# Patient Record
Sex: Female | Born: 1980 | Hispanic: No | Marital: Married | Smoking: Never smoker
Health system: Southern US, Community
[De-identification: ages and names within clinical notes are randomized; demographics above are authoritative.]

## PROBLEM LIST (undated history)

## (undated) DIAGNOSIS — R51 Headache: Secondary | ICD-10-CM

## (undated) DIAGNOSIS — R519 Headache, unspecified: Secondary | ICD-10-CM

## (undated) DIAGNOSIS — F329 Major depressive disorder, single episode, unspecified: Secondary | ICD-10-CM

## (undated) DIAGNOSIS — A499 Bacterial infection, unspecified: Secondary | ICD-10-CM

## (undated) DIAGNOSIS — N926 Irregular menstruation, unspecified: Secondary | ICD-10-CM

## (undated) DIAGNOSIS — F32A Depression, unspecified: Secondary | ICD-10-CM

## (undated) DIAGNOSIS — K219 Gastro-esophageal reflux disease without esophagitis: Secondary | ICD-10-CM

## (undated) DIAGNOSIS — J302 Other seasonal allergic rhinitis: Secondary | ICD-10-CM

## (undated) HISTORY — PX: DILATION AND CURETTAGE OF UTERUS: SHX78

## (undated) HISTORY — DX: Depression, unspecified: F32.A

## (undated) HISTORY — DX: Headache: R51

## (undated) HISTORY — DX: Major depressive disorder, single episode, unspecified: F32.9

## (undated) HISTORY — DX: Bacterial infection, unspecified: A49.9

## (undated) HISTORY — DX: Headache, unspecified: R51.9

## (undated) HISTORY — PX: GALLBLADDER SURGERY: SHX652

## (undated) HISTORY — DX: Irregular menstruation, unspecified: N92.6

---

## 2005-04-30 ENCOUNTER — Inpatient Hospital Stay (HOSPITAL_COMMUNITY): Admission: AD | Admit: 2005-04-30 | Discharge: 2005-05-02 | Payer: Self-pay | Admitting: *Deleted

## 2005-05-03 ENCOUNTER — Ambulatory Visit (HOSPITAL_COMMUNITY): Admission: RE | Admit: 2005-05-03 | Discharge: 2005-05-03 | Payer: Self-pay | Admitting: General Surgery

## 2005-05-11 ENCOUNTER — Inpatient Hospital Stay (HOSPITAL_COMMUNITY): Admission: AD | Admit: 2005-05-11 | Discharge: 2005-05-11 | Payer: Self-pay | Admitting: Obstetrics & Gynecology

## 2005-05-11 ENCOUNTER — Ambulatory Visit: Payer: Self-pay | Admitting: Obstetrics & Gynecology

## 2005-05-14 ENCOUNTER — Inpatient Hospital Stay (HOSPITAL_COMMUNITY): Admission: AD | Admit: 2005-05-14 | Discharge: 2005-05-14 | Payer: Self-pay | Admitting: Obstetrics & Gynecology

## 2005-05-22 ENCOUNTER — Ambulatory Visit (HOSPITAL_COMMUNITY): Admission: RE | Admit: 2005-05-22 | Discharge: 2005-05-22 | Payer: Self-pay | Admitting: Obstetrics & Gynecology

## 2005-05-22 ENCOUNTER — Ambulatory Visit: Payer: Self-pay | Admitting: Obstetrics & Gynecology

## 2005-05-24 ENCOUNTER — Ambulatory Visit: Payer: Self-pay | Admitting: Obstetrics & Gynecology

## 2005-05-24 ENCOUNTER — Ambulatory Visit (HOSPITAL_COMMUNITY): Admission: RE | Admit: 2005-05-24 | Discharge: 2005-05-24 | Payer: Self-pay | Admitting: Obstetrics & Gynecology

## 2005-05-24 ENCOUNTER — Encounter (INDEPENDENT_AMBULATORY_CARE_PROVIDER_SITE_OTHER): Payer: Self-pay | Admitting: Specialist

## 2005-06-10 ENCOUNTER — Encounter (INDEPENDENT_AMBULATORY_CARE_PROVIDER_SITE_OTHER): Payer: Self-pay | Admitting: *Deleted

## 2005-06-10 ENCOUNTER — Ambulatory Visit (HOSPITAL_COMMUNITY): Admission: RE | Admit: 2005-06-10 | Discharge: 2005-06-11 | Payer: Self-pay | Admitting: General Surgery

## 2005-07-18 ENCOUNTER — Ambulatory Visit: Payer: Self-pay | Admitting: Obstetrics and Gynecology

## 2005-09-03 ENCOUNTER — Ambulatory Visit: Payer: Self-pay | Admitting: Obstetrics and Gynecology

## 2005-12-02 ENCOUNTER — Other Ambulatory Visit: Admission: RE | Admit: 2005-12-02 | Discharge: 2005-12-02 | Payer: Self-pay | Admitting: Obstetrics and Gynecology

## 2006-04-11 ENCOUNTER — Inpatient Hospital Stay (HOSPITAL_COMMUNITY): Admission: AD | Admit: 2006-04-11 | Discharge: 2006-04-14 | Payer: Self-pay | Admitting: Obstetrics and Gynecology

## 2006-04-11 ENCOUNTER — Encounter (INDEPENDENT_AMBULATORY_CARE_PROVIDER_SITE_OTHER): Payer: Self-pay | Admitting: *Deleted

## 2006-08-08 IMAGING — US US OB LIMITED
1 series · 14 of 21 positions shown · non-contrast
Comparison: none

CLINICAL DATA: Assess fetal viability, early pregnancy.  Cramping.

[Series 1: us ob limited · 21 acquisitions, 14 frames shown]
[im 1/21]
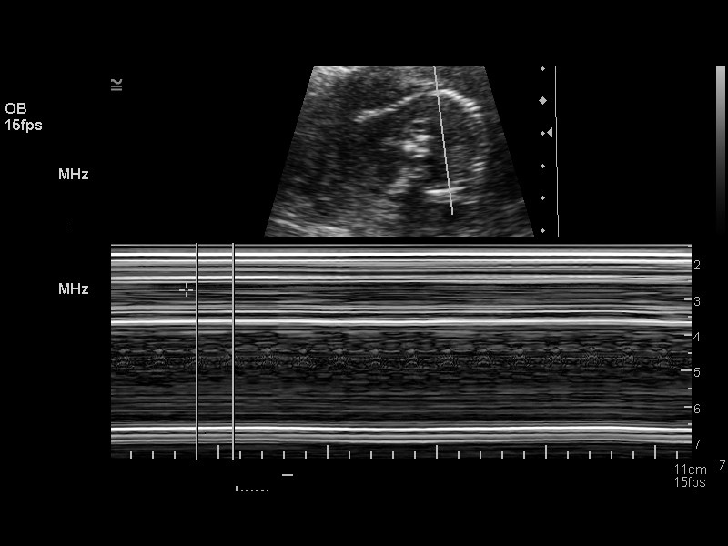
[im 3/21]
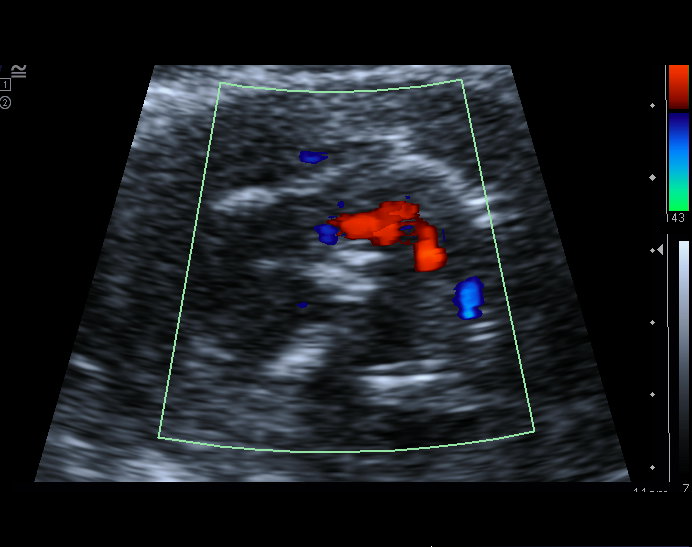
[im 4/21]
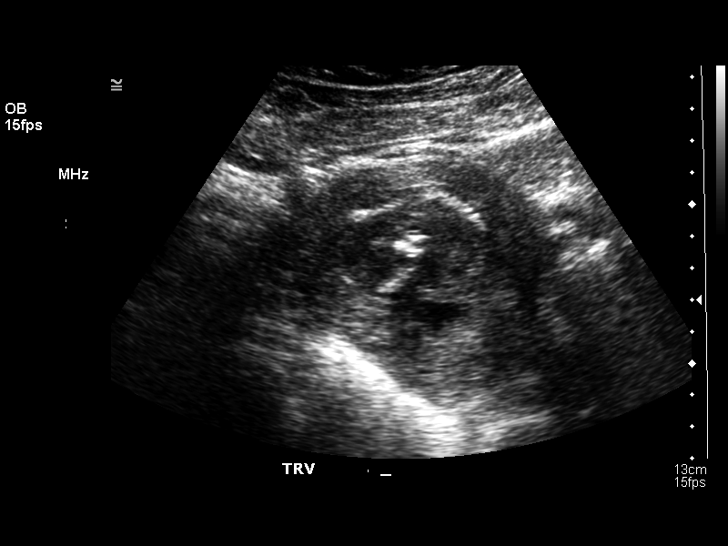
[im 6/21]
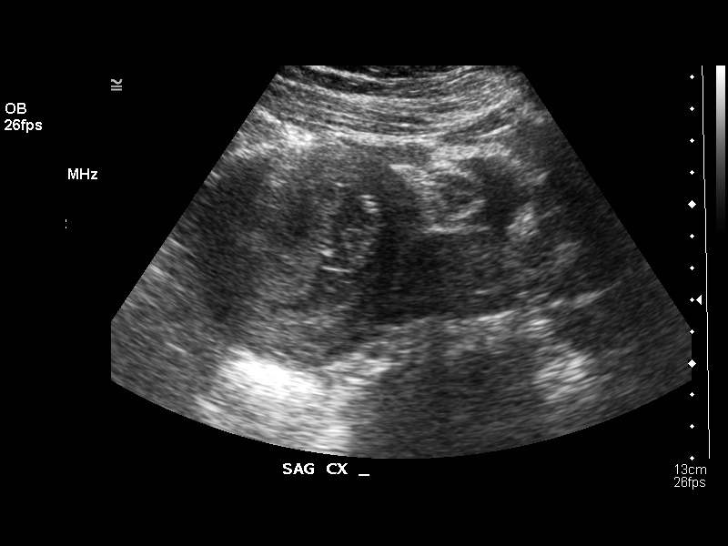
[im 7/21]
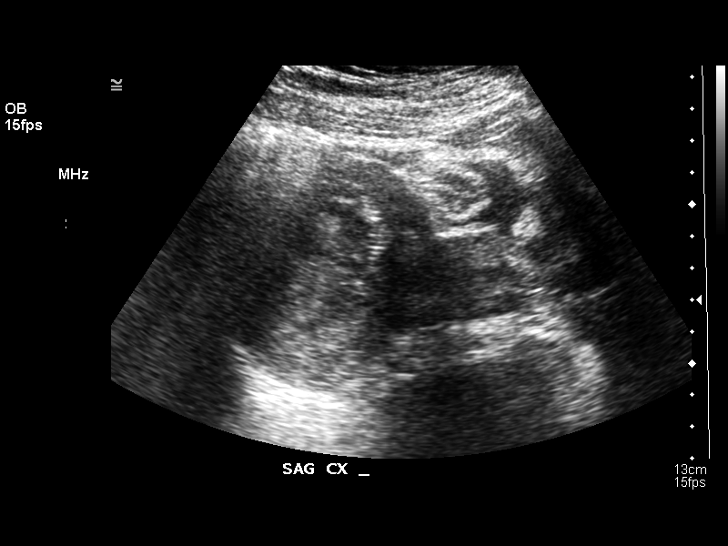
[im 9/21]
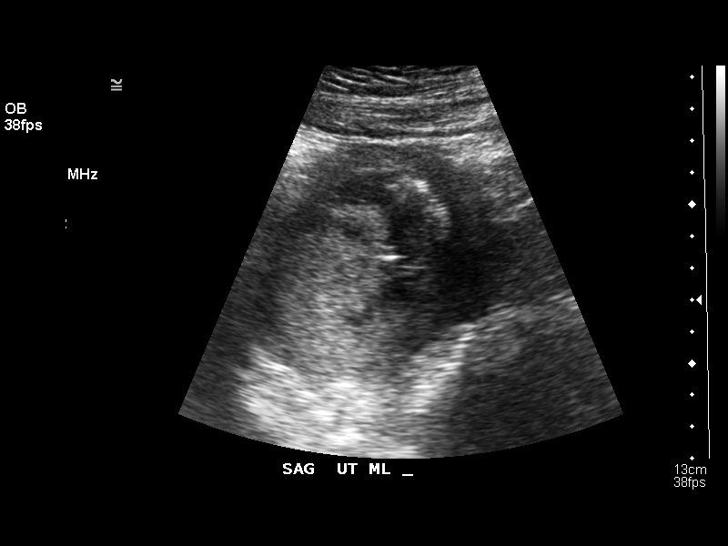
[im 10/21]
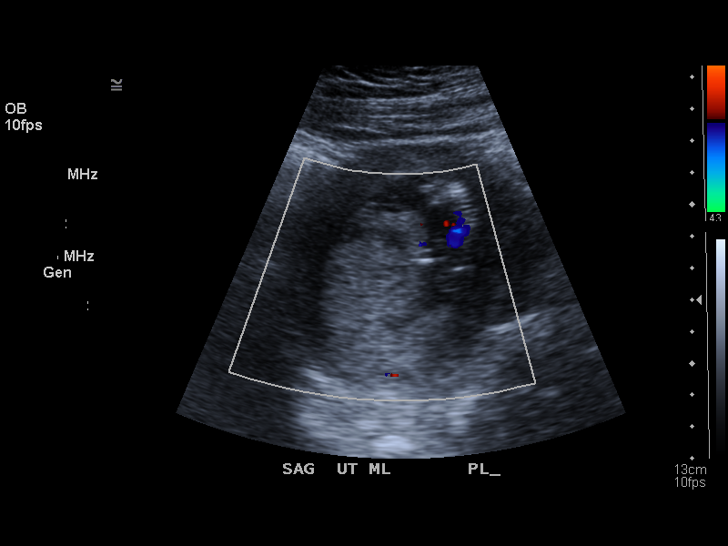
[im 12/21]
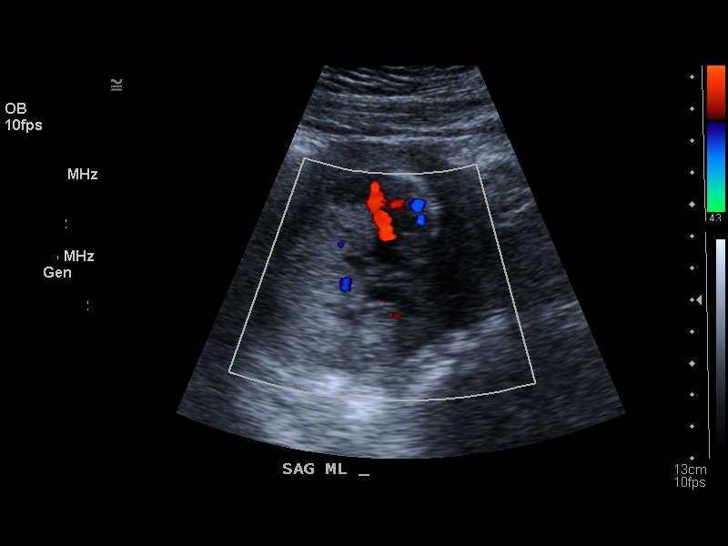
[im 13/21]
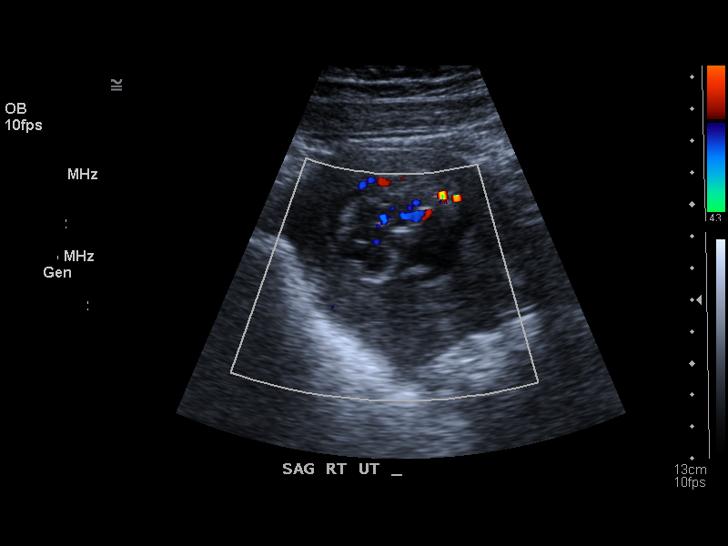
[im 15/21]
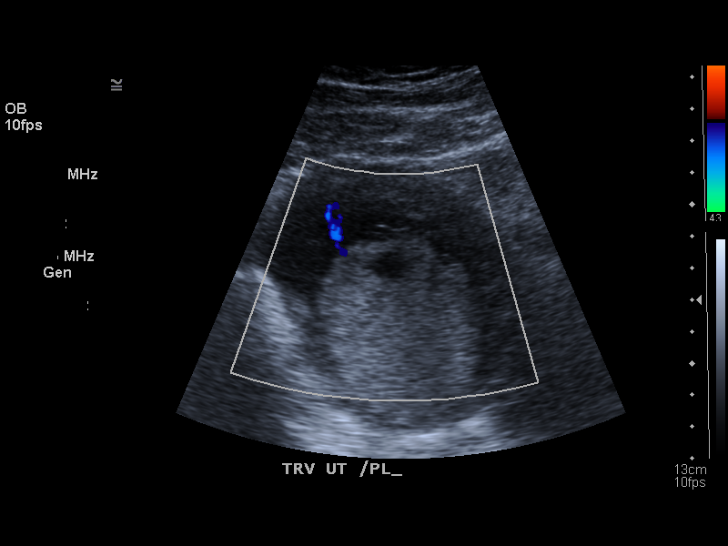
[im 16/21]
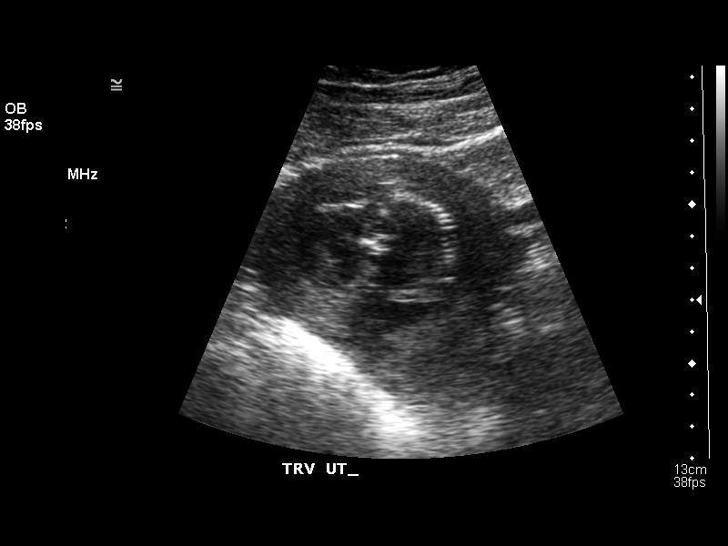
[im 18/21]
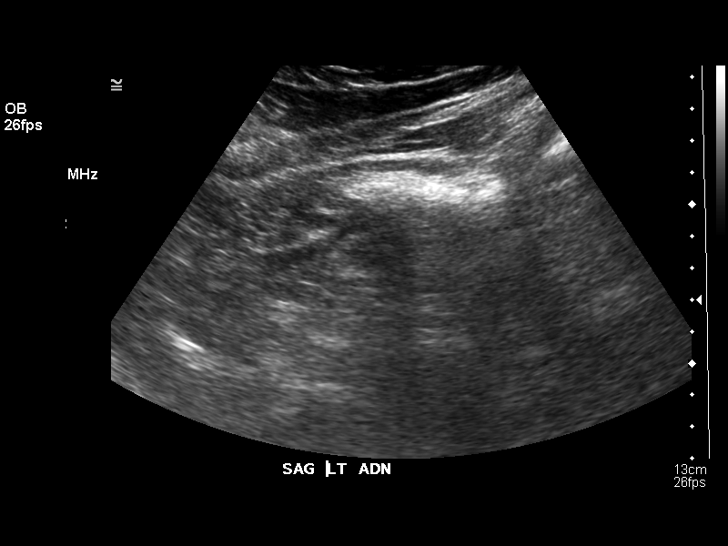
[im 19/21]
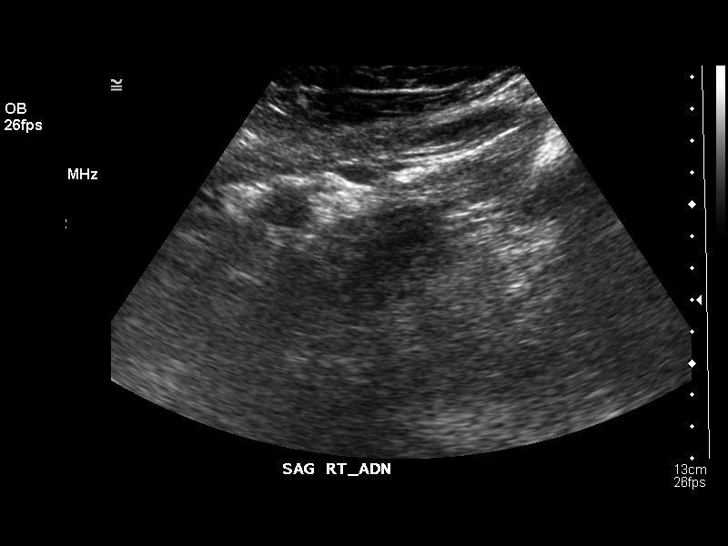
[im 21/21]
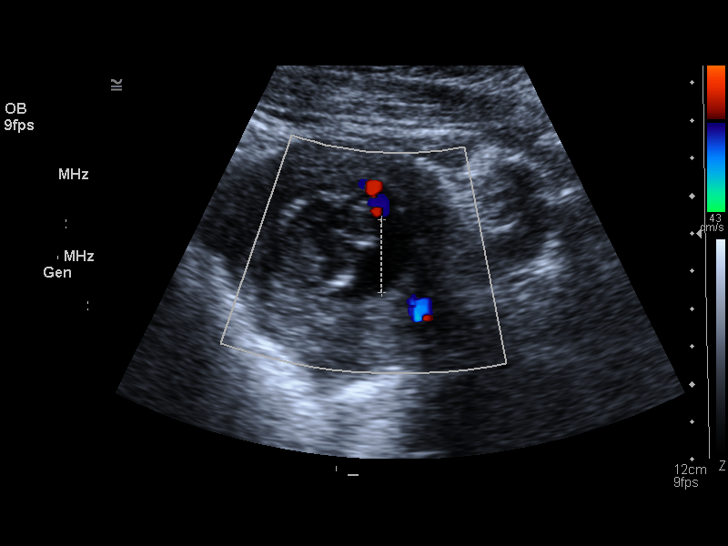

[14 of 21 positions shown; findings below may reference images not displayed]

LIMITED OBSTETRICAL ULTRASOUND:
 Number of Fetuses:  1
 Heart Rate:  150
 Movement:  Yes
 Breathing:  No
 Presentation:  Transverse with head to maternal right
 Placental Location:  Posterior
 Grade:  0
 Amniotic Fluid (Subjective):  Decreased
 Amniotic Fluid (Objective):  1.9 cm Vertical pocket 

 Fetal measurements and complete anatomic evaluation were not requested.  

 MATERNAL UTERINE AND ADNEXAL FINDINGS
 Cervix: 3.0 cm Transabdominally. Ovaries not visualized.
IMPRESSION: 1.  Single live intrauterine gestation seen.  Fetal cardiac rate of 180 bpm.  Amniotic fluid volume visually appears low with largest vertical pocket 1.9 cm.  Cervix appears closed, 3 cm in length.
 2.  Limited evaluation of morphology and placenta due to early gestational age and low amniotic fluid volume.

## 2006-09-04 IMAGING — RF DG CHOLANGIOGRAM OPERATIVE
1 series · 4 of 4 positions shown · non-contrast
Comparison: none

CLINICAL DATA: 24 year-old-female with biliary pancreatitis.  
 INTRAOPERATIVE DIAGNOSTIC CHOLANGIOGRAM:

[Series 1: run · 4 of 79 frames shown]
[frame 12/79]
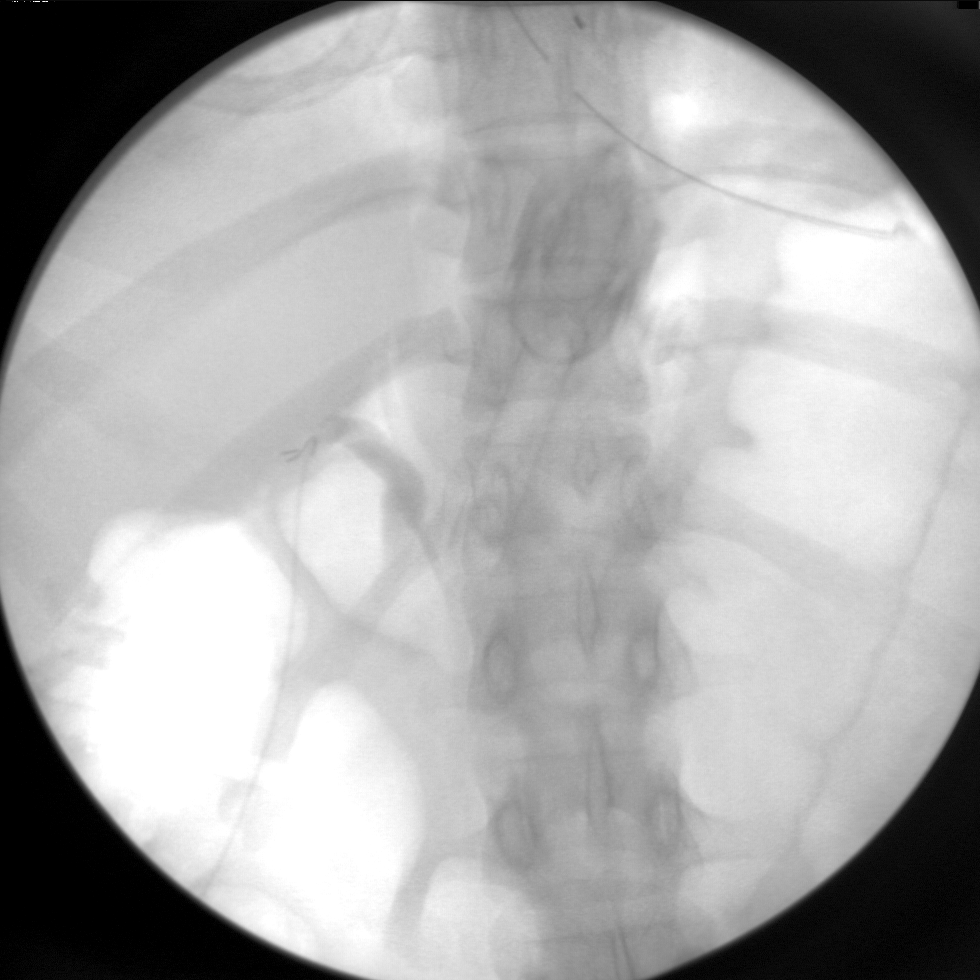
[frame 40/79]
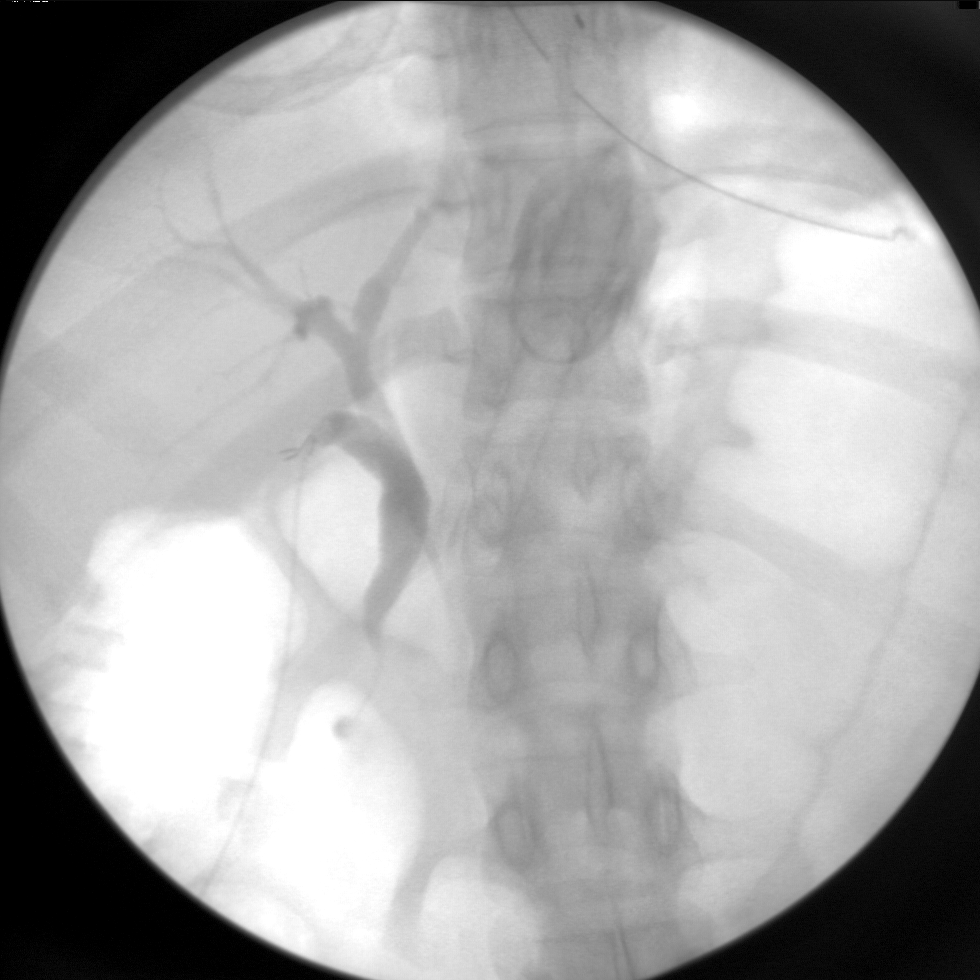
[frame 68/79]
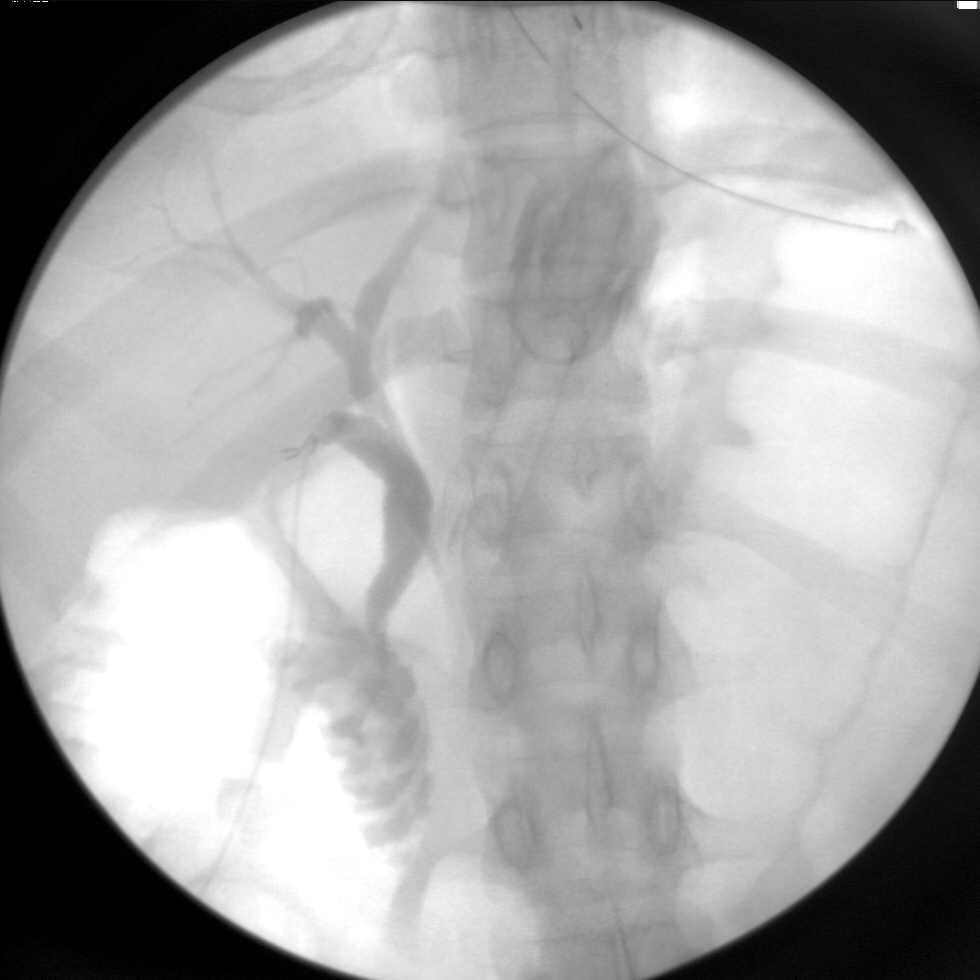
[frame 77/79]
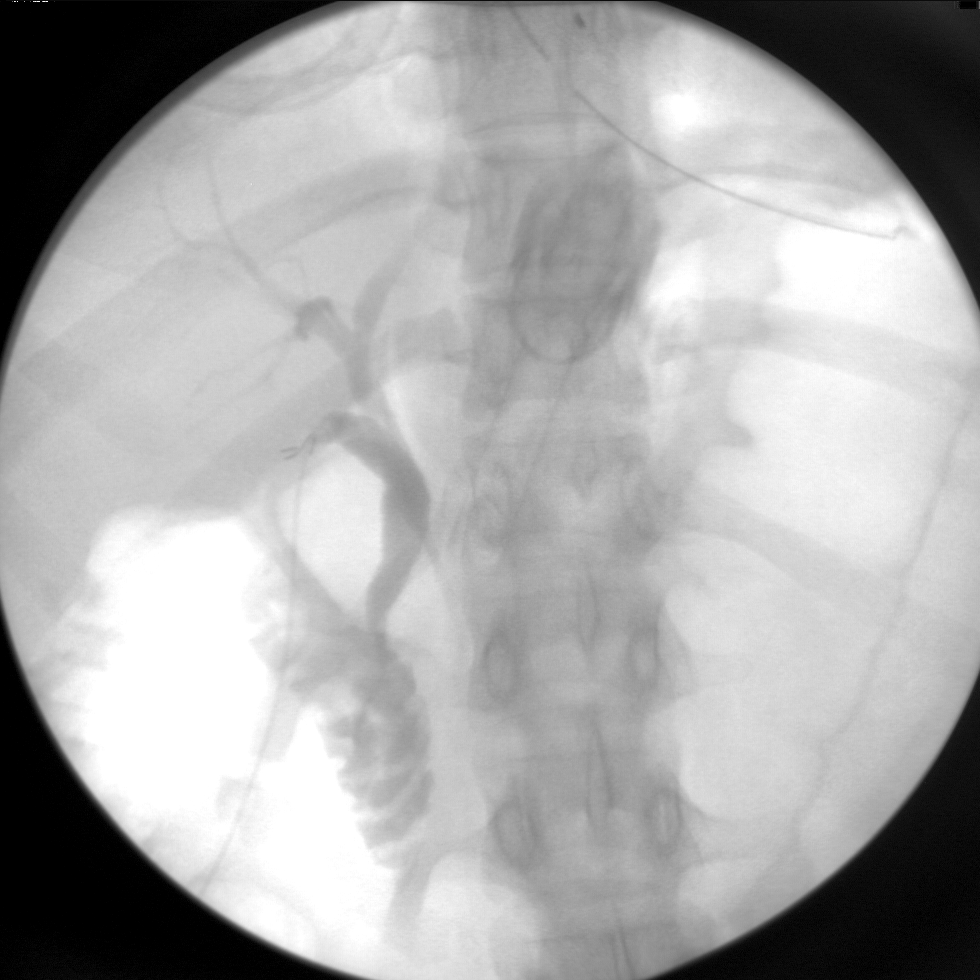

[4 of 4 positions shown; findings below may reference images not displayed]

FINDINGS: Intraoperative cholangiogram is performed following laparoscopic cholecystectomy.  There is a small catheter within the residual cystic duct.  Adjacent clips are present.  The opacified biliary system demonstrates mild prominence but evidence of spontaneous drainage with contrast entering the duodenum.  No biliary obstruction, retained stone, disease or choledocholithiasis.
IMPRESSION: Patent biliary system.

## 2008-01-14 ENCOUNTER — Inpatient Hospital Stay (HOSPITAL_COMMUNITY): Admission: AD | Admit: 2008-01-14 | Discharge: 2008-01-16 | Payer: Self-pay | Admitting: Obstetrics & Gynecology

## 2008-02-14 ENCOUNTER — Inpatient Hospital Stay (HOSPITAL_COMMUNITY): Admission: AD | Admit: 2008-02-14 | Discharge: 2008-02-17 | Payer: Self-pay | Admitting: Obstetrics and Gynecology

## 2010-07-02 ENCOUNTER — Emergency Department (HOSPITAL_COMMUNITY): Admission: EM | Admit: 2010-07-02 | Discharge: 2010-07-02 | Payer: Self-pay | Admitting: Emergency Medicine
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
## 2011-03-26 NOTE — H&P (Signed)
[name], [name]            ACCOUNT NO.:  [id number]   MEDICAL RECORD NO.:  [id number]          PATIENT TYPE:  INP   LOCATION:  9316                          [facility]   PHYSICIAN:  Osborn Coho, M.D.   DATE OF BIRTH:  [birth year]   DATE OF ADMISSION:  02/14/2008  DATE OF DISCHARGE:                              HISTORY & PHYSICAL   The patient is a 30 year old married [place] female who is 1 month  postpartum status post a spontaneous vaginal delivery who presents with  chief complaint of right-sided/back pain x3 days, nausea and vomiting  with just nausea this morning, headache which began this morning, and a  fever with T max of 103 with onset yesterday.  She is a gravida 3, para  2-0-1-2.  She is both breast feeding and bottle feeding.  She reports  her left fluid intake was 9:00 a.m. this morning when she had some tea.  Last solid food intake was 2100 last night.  She denies any major  medical problems.   PAST MEDICAL HISTORY:  1. C-section x1 with her first delivery.  2. Successful VBAC with this delivery.  3. Laparoscopic cholecystectomy.  4. D&C after spontaneous abortion.  5. Female circumcision.   She denies other medical history or past surgical history.   She has reports of constipation.  She denies any shortness of breath,  cough or other signs and symptoms of infection.  She does report some  dysuria and feeling like something in bladder after urination.  She  reports that lochia has ceased and no abnormal discharge noted.   ALLERGIES:  NO KNOWN DRUG ALLERGIES OR OTHER SENSITIVITIES.   PRESENT MEDICATIONS:  1. Prenatal vitamin 1 tablet p.o. daily  2. Motrin 600 mg p.o. t.i.d.   SOCIAL HISTORY:  She is a married [place] female.  Father of the baby  and husband was present initially and then went home to take care of her  two children and seems supportive.   FAMILY HISTORY:  Not significant.   LABORATORY DATA:  White count was 14.2, hemoglobin  12.6, hematocrit was  38, platelets 260.  Differential:  Neutrophils were high at 81%,  absolute granulocytes were high at 11.5, absolute monocytes 1.1,  lymphocytes were low at 11.  CMP was within normal limits, with the  exception of glucose being slightly high equal to 102.  Uric acid and  LDH were normal.  UA:  Hazy, specific gravity was 1020, moderate  hemoglobin, positive nitrites, moderate leukocyte esterase, and UA micro  showed many bacteria and few epithelials.  She does have blood cultures  x2 that are pending.   PHYSICAL EXAMINATION:  VITAL SIGNS:  On admission, temperature orally  was 104.1, blood pressure 96/32, heart rate was 32, respirations were  20.  GENERAL:  No acute distress.  She is alert and oriented x3 but is weak,  slow movement when asked to change her position.  There is a slight  language barrier.  She does speak [place].  CARDIOVASCULAR:  Regular rate and rhythm without murmur.  LUNGS:  Clear to auscultation bilaterally.  She has positive  right CVA  tenderness.  ABDOMEN:  Soft, nontender.  No rebound or guarding.  Fundus is not  palpable.  PELVIC:  Deferred.  EXTREMITIES:  No edema, no clonus.  DTRs within normal limits and  negative Homan's.   IMPRESSION:  1. Probable pyelonephritis.  2. Fever with maximum temperature of 104.1 after admission.  She did      receive 1 gram of Tylenol and decreased to 102.5.  3. Right flank pain x3 days.  4. Nausea and vomiting with just nausea today.  5. Positive costovertebral angle tenderness.   PLAN:  After consult with Dr. Osborn Coho, plan was made to admit the  patient for IV antibiotic therapy and to repeat her CBC with  differential in a.m. and to receive IV fluids.  Will continue to observe  closely, and M.D. to follow.      Candice [place], [place]      Osborn Coho, M.D.  Electronically Signed    CHS/MEDQ  D:  02/14/2008  T:  02/14/2008  Job:  [id number]

## 2011-03-29 NOTE — Op Note (Signed)
[name], [name]            ACCOUNT NO.:  [id number]   MEDICAL RECORD NO.:  [id number]          PATIENT TYPE:  INP   LOCATION:  9130                          [facility]   PHYSICIAN:  Crist Fat. Rivard, M.D. DATE OF BIRTH:  1981/09/22   DATE OF PROCEDURE:  04/11/2006  DATE OF DISCHARGE:                                 OPERATIVE REPORT   PREOPERATIVE DIAGNOSIS:  Intrauterine pregnancy at 37 weeks and 4 days,  breech presentation with severe oligohydramnios.   POSTOPERATIVE DIAGNOSIS:  Intrauterine pregnancy at 37 weeks and 4 days,  breech presentation with severe oligohydramnios.   PROCEDURE:  Primary low transverse cesarean section.   SURGEON:  Dr. Dois Davenport Rivard.   ASSISTANT:  Elsie Ra, certified nurse midwife.   ESTIMATED BLOOD LOSS:  500 mL.   PROCEDURE:  After being informed of the planned procedure with possible  complications including bleeding, infection, injury to bowel, bladder or  ureters, informed consent is obtained.  The patient is taken to OR #1 given  spinal anesthesia by Dr. Tacy Dura without complication.  She is placed in the  dorsal decubitus position, pelvis tilted to the left, prepped and draped in  a sterile fashion and a Foley catheter is inserted in her bladder.  After  assessing adequate level of anesthesia, we infiltrate the suprapubic area  with 20 mL of Marcaine 0.25 and we perform a Pfannenstiel incision which was  brought down to the fascia.  The fascia is incised in a low transverse  fashion.  Linea alba is dissected.  Peritoneum was entered in the midline  fashion.  Visceral peritoneum is entered in a low transverse fashion  allowing [place] to safely retract bladder by developing a bladder flap.  Myometrium is then entered in a low transverse fashion first sharply with  knife and extended bluntly.  Amniotic fluid is clear.  We assist the birth  of a female infant in frank breech at 4:49 p.m. A nuchal cord reduces itself  spontaneously with  delivery of the head which went rapidly.  Mouth and nose  were suctioned.  Cord is clamped with two Kelly clamps and sectioned and  baby is given to the pediatrician present in the room.  20 mL of blood was  drawn from the umbilical vein and the placenta is allowed to deliver  spontaneously.  It is complete.  The cord has three vessels and uterine  revision is negative.  Placenta is sent to pathology.   We proceed with closure of the myometrium in two layers, first with a  running lock suture of 0 Vicryl, then with a Lembert suture of 0 Vicryl  imbricating the first layer.  Hemostasis was checked and deemed adequate.  Both paracolic gutters were cleaned.  Both tubes and ovaries assessed and  normal.  The pelvis is then profusely irrigated with warm saline and  hemostasis is rechecked and deemed adequate.   Under fascia hemostasis was completed with cautery and the fascia is closed  with two running sutures of one Vicryl meeting midline.  The wound is then  irrigated with warm saline.  Hemostasis was completed  with cautery and the  skin is closed with a subcuticular suture of 3-0 Monocryl and Steri-Strips.   Instruments and sponge count is complete x2.  Estimated blood loss is 500  mL.  The procedure is very well tolerated by the patient who is taken to  recovery room in a well and stable condition.   Little girl, unnamed at this time, was born at 4:49 p.m. received an Apgar  of 9 at one  minute 9 at five minutes and weighs 6 pounds 5 ounces.      Crist Fat Rivard, M.D.  Electronically Signed     SAR/MEDQ  D:  04/11/2006  T:  04/12/2006  Job:  [id number]

## 2011-03-29 NOTE — Op Note (Signed)
NAMENORIE, LATENDRESSE            ACCOUNT NO.:  [id number]   MEDICAL RECORD NO.:  [id number]          PATIENT TYPE:  AMB   LOCATION:  DAY                          FACILITY:  [facility]   PHYSICIAN:  Sharlet Salina T. Hoxworth, M.D.DATE OF BIRTH:  Mar 06, 1981   DATE OF PROCEDURE:  06/10/2005  DATE OF DISCHARGE:                                 OPERATIVE REPORT   PREOPERATIVE DIAGNOSES:  Cholelithiasis and history of gallstone  pancreatitis.   POSTOPERATIVE DIAGNOSES:  Cholelithiasis and history of gallstone  pancreatitis.   SURGICAL PROCEDURES:  Laparoscopic cholecystectomy with intraoperative  cholangiogram.   SURGEON:  Sharlet Salina T. Hoxworth, M.D.   ASSISTANTAngelia Mould. Derrell Lolling, M.D.   ANESTHESIA:  General.   BRIEF HISTORY:  Ms. Murthy is a 30 year old female with a recent episode  of transient severe epigastric pain. There was biochemical evidence of acute  pancreatitis that resolved and gallbladder ultrasound has shown multiple  gallstones. Laparoscopic cholecystectomy with cholangiogram has been  recommended and accepted. The nature of the procedure, indications, risks of  bleeding, infection, bile leak and bile duct injury were discussed and  understood. She is now brought to the operating room for this procedure.   DESCRIPTION OF PROCEDURE:  The patient was brought to the operating room,  placed in supine position on the operating table and general endotracheal  anesthesia was induced. The abdomen was widely sterilely prepped and draped.  She received preoperative antibiotics. PAS were in place. Correct patient  and procedure were confirmed. Local anesthesia was used to infiltrate the  trocar sites prior to the incisions. A 1 cm incision was made at the  umbilicus and dissection carried down the midline fascia which was sharply  incised for 1 cm and the peritoneum entered under direct vision. Through a  mattress suture of 0 Vicryl, the Hasson trocar was placed and  pneumoperitoneum established. Under direct vision, a 10 mm trocar was placed  in the subxiphoid area and two 5 mm trocars along the right subcostal  margin. The gallbladder was visualized and appeared somewhat chronically  thickened. The fundus was grasped and elevated up over the liver and the  infundibulum retracted inferolaterally. The common bile duct was visualized  and appeared upper limits of normal. Fibrofatty tissue was stripped down off  of the neck of the gallbladder toward the porta hepatis. The cystic duct,  lymph node and artery were identified and the gallbladder wall divided  between clips. The cystic duct was dissected free over about a centimeter  and a nice window created clarifying the anatomy. The cystic duct did appear  somewhat dilated consistent with passage of stones. The cystic duct was  clipped at the junction of the gallbladder and operative cholangiogram  obtained through the cystic duct. This showed good filling of a normal  common bile duct and intrahepatic ducts with free flow into the duodenum and  no filling defects. The cystic duct was clipped. The clip did traverse the  duct, but due to some chronic thickening and slight dilatation, we also  placed a PDS Endoloop over the cystic duct after division. The gallbladder  was then  dissected free from its bed using hook cautery, placed in the  EndoCatch bag and brought out through the umbilicus. The right upper  quadrant was irrigated and complete hemostasis assured. Trocars removed  under direct vision and all CO2 evacuated. The mattress suture was secured  at the umbilicus. The skin incisions were closed with interrupted  subcuticular 4-0 Monocryl and Steri-Strips. Sponge, needle and instrument  counts were correct. Dry sterile dressings were applied and the patient  taken to recovery room in good condition.       BTH/MEDQ  D:  06/10/2005  T:  06/11/2005  Job:  [id number]

## 2011-03-29 NOTE — Op Note (Signed)
[name], [name]            ACCOUNT NO.:  [id number]   MEDICAL RECORD NO.:  [id number]          PATIENT TYPE:  AMB   LOCATION:  SDC                           [facility]   PHYSICIAN:  Lesly Dukes, M.D. DATE OF BIRTH:  December 17, 1980   DATE OF PROCEDURE:  05/24/2005  DATE OF DISCHARGE:                                 OPERATIVE REPORT   POSTOPERATIVE DIAGNOSIS:  The patient is a 30 year old G1, P0, at 13-1/2  weeks' estimated gestational age with missed abortion and prolonged  premature rupture of membranes.   PREOPERATIVE DIAGNOSIS:  The patient is a 30 year old G1, P0, at 13-1/2  weeks' estimated gestational age with missed abortion and prolonged  premature rupture of membranes.   PROCEDURES:  1.  Dilation and evacuation.  2.  Repair of cervical laceration.   SURGEON:  Lesly Dukes, M.D.   ANESTHESIA:  MAC and local.   SPECIMENS:  EMC.   ESTIMATED BLOOD LOSS:  400.   COMPLICATIONS:  None.   FINDINGS:  A 13 week 2 day size fetus with a retroverted uterus.   PROCEDURE:  After informed consent was obtained, the patient was taken to  the operating room, where MAC anesthesia was induced.  The patient was  placed in the dorsal lithotomy position and prepared and draped in the  normal sterile fashion.  The bladder was in-and-out catheterized.  The  anterior lip of the cervix was grasped with a single-tooth tenaculum and the  cervix was gently dilated to a #12 Hegar dilator.  A curved #12 suction  curette was gently introduced into the uterus under ultrasound guidance.  A  gentle suction curettage was noted until the uterus was noted to be empty on  ultrasound.  Inspection of the anterior lip of the cervix after the D&E  revealed some lacerations from the single-tooth tenaculum.  The anterior lip  of the cervix was reapproximated with 0 chromic in a running locked fashion.  Good hemostasis was noted.  The patient will be told about the cervical  laceration.  The  patient tolerated the procedure well.  The sponge, lap,  instrument and needle count were correct x2 and the patient went to recovery  in stable condition.      KHL/MEDQ  D:  05/24/2005  T:  05/24/2005  Job:  [id number]

## 2011-03-29 NOTE — Group Therapy Note (Signed)
NAMENOHELI, MELDER NO.:  [id number]   MEDICAL RECORD NO.:  [id number]          PATIENT TYPE:  WOC   LOCATION:  [facility]                   FACILITY:  [facility]   PHYSICIAN:  Argentina Donovan, MD        DATE OF BIRTH:  April 19, 1981   DATE OF SERVICE:  07/18/2005                                    CLINIC NOTE   REASON FOR VISIT:  The patient is a 30 year old gravida 1 para 0-0-1-0 who  had a 13-week loss and who just recently in July had a D&C for a missed  abortion. The patient has no problem since that time. We are going to check  her with an anticardiolipin antibody, antiphospholipid antibody, TSH,  antithyroid antibody, and antinuclear antibody, and start her on prenatal  vitamins with folic acid. We also suggest she goes to the high risk clinic  when she does get pregnant, and we have counseled them against getting  pregnant for at least 1 more month.   IMPRESSION:  Mid trimester miscarriage.           ______________________________  Argentina Donovan, MD     PR/MEDQ  D:  07/18/2005  T:  07/19/2005  Job:  [id number]

## 2011-03-29 NOTE — Group Therapy Note (Signed)
NAMEZOELLE, MARKUS NO.:  [id number]   MEDICAL RECORD NO.:  [id number]          PATIENT TYPE:  WOC   LOCATION:  [facility]                   [facility]ILITY:  [facility]   PHYSICIAN:  Argentina Donovan, MD        DATE OF BIRTH:  1981-09-18   DATE OF SERVICE:                                    CLINIC NOTE   SUBJECTIVE:  The patient is a 30 year old gravida 1, para 0-0-1-0 who had a  13-week loss recently, and had a D&C for missed abortion.  She has had no  problem since that time, and comes in today after an antiphospholipid  screening, and we find that she has an elevated ANA with a 1:160 titer.  The  patient has been on multivitamins with folic acid.  I am starting her on a  baby aspirin today and referring her to the clinic.   IMPRESSION:  Post-spontaneous abortion with positive ANA titer.           ______________________________  Argentina Donovan, MD     PR/MEDQ  D:  09/03/2005  T:  09/03/2005  Job:  [id number]

## 2011-03-29 NOTE — Discharge Summary (Signed)
[name], [name]            ACCOUNT NO.:  [id number]   MEDICAL RECORD NO.:  [id number]          PATIENT TYPE:  INP   LOCATION:  9316                          [facility]ILITY:  [facility]   PHYSICIAN:  [name], M.D. DATE OF BIRTH:  1981/01/03   DATE OF ADMISSION:  02/14/2008  DATE OF DISCHARGE:  02/17/2008                               DISCHARGE SUMMARY   DISCHARGING PHYSICIAN:  Julie A. Dillard, MD   ADMISSION DIAGNOSES:  1. Four weeks postpartum.  2. Pyelonephritis.  3. Nausea and vomiting.   DISCHARGE DIAGNOSES:  1. Four weeks postpartum.  2. Pyelonephritis.  3. Nausea and vomiting.   HOSPITAL PROCEDURES:  1. IV hydration.  2. IV antibiotics.  3. Blood cultures.   HOSPITAL COURSE:  The patient was admitted 1 month postpartum with right-  sided back pain, flank pain, nausea, vomiting, headache, and fever.  She  had a white blood cell count of 14.2. Temperature was 104 degrees.  Urine culture was collected and blood cultures were collected.  She was  admitted for IV fluids and antibiotics, and was given Tylenol for her  fever.  Urinalysis showed moderate hemoglobin and positive nitrites with  moderate leukocyte esterase.  Chemistries were within normal limits.  Urine was sent to culture.  On hospital day #2, she was doing okay, but  had a headache and right flank pain was persistent.  She was breast-  feeding without problems.  She was afebrile at 98.1.  Other vital signs  were normal.  White blood cell count was down to 12.1 and hemoglobin  10.4.  Cultures were pending.  She continued to receive ceftriaxone.  On  hospital day #3, she was feeling better with less pain and her right  flank.  She was tolerating food and fluids by mouth.  Fevers began to  diminish.  Urine culture showed positive E. coli sensitive to Rocephin,  and blood culture showed no growth.  On hospital day #4, she was ready  to go home.  She was feeling better, breast-feeding without problems.  She  was afebrile for greater than 24 hours.  Chest was clear.  Heart,  regular rate and rhythm.  There was no CVA tenderness.  Abdomen, soft  and nontender.  Extremities within normal limits.  She was deemed to  receive full benefit of her hospital stay and was discharged home.   DISCHARGE LABS:  White blood cell count 12.1, hemoglobin 10.4, and  platelets 206.  Urine culture, E. coli.  Blood cultures negative.   DISCHARGE MEDICATIONS:  1. Motrin p.r.n.  2. Ampicillin 500 mg p.o. b.i.d. x7 days.   DISCHARGE INSTRUCTIONS:  Discharge instructions include adequate  hydration and monitoring for fever.   DISCHARGE FOLLOWUP:  In 2 weeks or p.r.n.   CONDITION ON DISCHARGE:  Good.      [name], C.N.M.      [name], M.D.  Electronically Signed    MLW/MEDQ  D:  04/06/2008  T:  04/07/2008  Job:  [id number]

## 2011-03-29 NOTE — H&P (Signed)
[name], [name]            ACCOUNT NO.:  [id number]   MEDICAL RECORD NO.:  [id number]          PATIENT TYPE:  OUT   LOCATION:  ULT                           [facility]   PHYSICIAN:  Lesly Dukes, M.D. DATE OF BIRTH:  04-30-1981   DATE OF ADMISSION:  05/22/2005  DATE OF DISCHARGE:                                HISTORY & PHYSICAL   HISTORY OF PRESENT ILLNESS:  The patient is a 30 year old gravida 1, para 0,  LMP January 28, 2005, with an approximately 13-1/2 week IUFD.  The patient was  PPROM'd approximately 2 weeks ago and had refused D&E until the fetus died.  The patient missed several appointments, however, did show up today and was  noted to have no FH.  There was an official ultrasound by radiology.  The  patient now agrees to have a D&E.  She understands the risks of this  procedure including, but not limited to, bleeding, infection, and damage to  the uterus.  We also talked about the likelihood of intrauterine scarring,  however, this is very unlikely.   PAST MEDICAL HISTORY:  Gallstones in need of a cholecystectomy.  She was to  have this during her second trimester of pregnancy.  She is holding off on  this for now and will do it after the D&E.   PAST SURGICAL HISTORY:  Denies.   ALLERGIES:  Denies.   MEDICATIONS:  Denies.   OB HISTORY/GYN HISTORY:  Gravida 1.  Has never had a pelvic, so has never  had any GYN problems that she is aware of.   PHYSICAL EXAMINATION:  GENERAL:  Well-nourished, well-developed, and in no  apparent distress.  LUNGS:  Clear to auscultation bilaterally.  ABDOMEN:  Soft, nontender, and nondistended.  BACK:  No CVA tenderness.  CARDIOVASCULAR:  Regular rate and rhythm.  PELVIC:  Genitalia is Tanner V  the rest of the pelvic cannot be done unless  the patient is heavily sedated.  We will do a pelvic prior to the procedure  after the patient is under anesthesia.   ASSESSMENT:  A 30 year old gravida 1 female with 13-1/3 week  intrauterine  fetal demise.   PLAN:  Do a suction D&E and follow-up with general surgery afterward for a  possible cholecystectomy.       KHL/MEDQ  D:  05/22/2005  T:  05/22/2005  Job:  [id number]

## 2011-03-29 NOTE — H&P (Signed)
[name], [name]            ACCOUNT NO.:  [id number]   MEDICAL RECORD NO.:  [id number]          PATIENT TYPE:  MAT   LOCATION:  MATC                          [facility]  [facility]   PHYSICIAN:  Crist Fat. Rivard, M.D. DATE OF BIRTH:  09-Sep-1981   DATE OF ADMISSION:  04/11/2006  DATE OF DISCHARGE:                                HISTORY & PHYSICAL   Patient is a 30 year old gravida 2, para 0-0-1-0 who was admitted at 37-4/[redacted]  weeks gestation from [facility] office for primary low  transverse cesarean section secondary to breech presentation and  oligohydramnios.  Amniotic fluid index on ultrasound today found to be 6.13  cm which corresponds to less than the third percentile for amniotic fluid  for gestational age.  This amniotic fluid index is decreased from 7.13 cm on  Apr 09, 2006.  Biophysical profile obtained today at [facility] office with  results of 8/8.  Fetus is also noted to be in breech presentation.  Patient  reports that her fetus is moving normally.  Patient is not having regular  contractions and denies leakage of fluid or bleeding.  Patient's last p.o.  intake at approximately noon April 11, 2006.  Patient's pregnancy has been  remarkable for status post cholecystectomy prior to pregnancy, language  barrier as patient is from [place], female circumcision, group B Strep in  urine, and history of oligohydramnios at 30 weeks resolved spontaneously.   HISTORY OF PRESENT PREGNANCY:  Patient entered care at [redacted] weeks gestation.  Patient's pregnancy has been followed by the C.N.M. service at [facility].  Patient's EDC is April 28, 2006.  Ultrasound obtained at 20  weeks revealed growth consistent with previous dating, cervix 3.36 cm.  Group B Strep is noted in the urine at 24 weeks of gestation.  Patient was  treated with pen VK at that time for group B Strep bacteruria.  Glucola  obtained at 28 weeks with the result of 94.  Repeat urine culture at 28  weeks  revealed no group B Strep at that time.  Ultrasound done at 30 weeks  secondary to lagging fundal height revealed breech presentation, estimated  fetal weight at the 70th percentile, and AFI of 7.5 cm with normal Dopplers  and a left fetal renal pyelectasis.  Patient underwent a non-stress test at  that time and was placed on bed rest.  At that time follow-up ultrasound  repeated at 31 weeks revealed AFI within normal limits at 40th percentile  and normal biophysical profile.  Ultrasound repeated at 33 weeks to reassess  amniotic fluid revealed amniotic fluid index at 11.33 cm at the 30th  percentile and left renal pyelectasis decreased as well at that time.  The  patient was allowed to resume her normal activities at 31 weeks.  Patient  with complaints of upper respiratory tract infection at 35 weeks.  Patient  was given prescription for Zyrtec at 36 weeks due to allergies.  Patient  declined third trimester cultures.  Patient's ultrasound repeated at 37  weeks and 1 day revealed breech presentation and amniotic fluid index of  7.13  cm at the 4th percentile with biophysical of 8 out of 8.  Patient was  again placed on bed rest and was to increase her fluids.  Patient returned  to the office as directed on June 1, day of admission, for repeat ultrasound  for fluid which revealed AFI of 6.13 cm, less than the 3rd percentile.  Secondary to the continued decrease in amniotic fluid decision was made,  after consulting with Dr. Estanislado Pandy, to proceed with primary low transverse  cesarean section for the patient.  Patient and the husband were given risks,  benefits, and alternatives and agreed to proceed.   PRENATAL LABORATORIES:  Blood type O+.  Antibody screen negative.  Sickle  cell trait negative.  Initial hemoglobin 12.4, platelets 217,000.  RPR  nonreactive.  Rubella titer immune.  Hepatitis B surface antigen negative.  HIV nonreactive.  Glucola 94.  RPR at 28 weeks nonreactive.  Group B  Strep  positive in urine, therefore, vaginal tract culture not done.  The patient  declined third trimester GC/Chlamydia cultures.   GYN HISTORY:  Patient with monthly menses with no dysmenorrhea, no  intramenstrual bleeding.  Patient denies history of abnormal Paps or STDs.   MEDICAL HISTORY:  Significant for gallbladder disease in the past.   SURGICAL HISTORY:  Cholecystectomy July of 2006.   FAMILY HISTORY:  Paternal grandfather with hypertension.  Father with  diabetes.   GENETIC HISTORY:  Father of the baby's mother with twins, otherwise  negative.   ALLERGIES:  No known drug allergies.   CURRENT MEDICATIONS:  Prenatal vitamins.   SOCIAL HISTORY:  Patient is married to the father of the baby, Druscilla Brownie.  Father of the baby is involved and supportive.  Patient and her  husband are Muslim in their faith.  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  Father of the baby is a Curator with 16 years of education.  Patient denies use of alcohol, tobacco, and street drugs.   OB HISTORY:  Pregnancy #11 May 2005 spontaneous AB at 13 weeks with a D&C  performed without complications.  Pregnancy #2 is present.   PHYSICAL EXAMINATION:  VITAL SIGNS:  Patient is afebrile.  Vital signs are  stable.  HEENT:  Within normal limits.  LUNGS:  Clear.  HEART:  Regular rate and rhythm.  BREASTS:  Soft.  ABDOMEN:  Soft, gravid, and nontender with fundal height consistent with 36  cm.  Fetus noted to be in breech presentation in longitudinal lie on  Leopold's.  Fetal heart rate noted to be in the 140s with accelerations  noted to be present and variability also noted to be present.  Uterine  contractions:  None noted on external fetal monitor at the present time.  PELVIC:  Vaginal examination is deferred.  EXTREMITIES:  No edema noted.  Negative Homans' bilaterally.   ASSESSMENT:  1.  Intrauterine pregnancy 37-4/[redacted] weeks gestation.  2.  Oligohydramnios. 3.  Breech  presentation.   PLAN:  Consult obtained with Dr. Dois Davenport Rivard per Nigel Bridgeman, C.N.M.  Patient will be admitted for primary low transverse cesarean section.  Risks, benefits, and alternatives of primary low transverse cesarean section  discussed with the patient and her husband and they desire to proceed.  Routine preoperative orders per M.D.     Rhona Leavens, CNM      Crist Fat Rivard, M.D.  Electronically Signed   NOS/MEDQ  D:  04/11/2006  T:  04/11/2006  Job:  [id number]

## 2011-03-29 NOTE — Discharge Summary (Signed)
[name], [name]            ACCOUNT NO.:  [id number]   MEDICAL RECORD NO.:  [id number]          PATIENT TYPE:  INP   LOCATION:  9130                          [facility]   PHYSICIAN:  Hal Morales, M.D.DATE OF BIRTH:  07-Feb-1981   DATE OF ADMISSION:  04/11/2006  DATE OF DISCHARGE:  04/14/2006                                 DISCHARGE SUMMARY   ADMITTING DIAGNOSES:  1.  Intrauterine pregnancy at 37-4/7 weeks.  2.  Breech presentation.  3.  Severe oligohydramnios.   PROCEDURE:  Primary low transverse cesarean section.   DISCHARGE DIAGNOSES:  1.  Intrauterine pregnancy at term, delivered.  2.  Severe oligohydramnios.  3.  Primary low transverse cesarean section.   [name] is a 30 year old gravida 2, para 0-0-1-0, who was admitted at  37-4/7 weeks from the office of [facility] for primary low  transverse cesarean section secondary to breech presentation and severe  oligohydramnios.  This was accomplished on April 11, 2006, with Dr. Dois Davenport  Rivard as surgeon.  The patient gave birth to a 6 pound 5 ounce female  infant with Apgar scores of 9 at 1 minute  and 9 at 5 minutes. Both the  patient and infant have done well in the postoperative period. The patient's  vital signs have been stable.  She is afebrile.  Her hemoglobin on the first  postoperative day was 10. The baby is breast feeding well.  The patient's  incision is clean, dry and intact, and on this, her third postoperative day,  she is judged to be in satisfactory condition for discharge.  Discharge  instructions per [facility] handout.   DISCHARGE MEDICATIONS:  1.  Motrin 600 mg p.o. q. 6 h p.r.n. pain.  2.  Tylox 1 to 2 p.o. q. 3-4 h pain.  3.  Prenatal vitamins.   Discharge follow-up will be a [facility] in 6 weeks.      Rica Koyanagi, C.N.M.      Hal Morales, M.D.  Electronically Signed   SDM/MEDQ  D:  04/14/2006  T:  04/14/2006  Job:   [id number]

## 2011-08-05 LAB — CBC
MCHC: 33.9
MCV: 80
MCV: 80.1
Platelets: 226
RDW: 15.5
RDW: 15.8 — ABNORMAL HIGH

## 2011-08-05 LAB — RPR: RPR Ser Ql: NONREACTIVE

## 2011-08-06 LAB — COMPREHENSIVE METABOLIC PANEL
ALT: 31
Albumin: 3.6
Alkaline Phosphatase: 122 — ABNORMAL HIGH
BUN: 8
CO2: 26
Calcium: 9.3
Chloride: 102
Creatinine, Ser: 0.89
Potassium: 4.2
Total Protein: 8.2

## 2011-08-06 LAB — URINE CULTURE: Colony Count: >=100000

## 2011-08-06 LAB — DIFFERENTIAL
Basophils Absolute: 0
Basophils Absolute: 0
Eosinophils Absolute: 0
Eosinophils Relative: 0
Lymphocytes Relative: 11 — ABNORMAL LOW
Lymphocytes Relative: 18
Monocytes Absolute: 1.1 — ABNORMAL HIGH
Monocytes Relative: 10
Monocytes Relative: 8
Neutro Abs: 11.5 — ABNORMAL HIGH

## 2011-08-06 LAB — URINALYSIS, ROUTINE W REFLEX MICROSCOPIC
Bilirubin Urine: NEGATIVE
pH: 5

## 2011-08-06 LAB — CULTURE, BLOOD (ROUTINE X 2): Culture: NO GROWTH

## 2011-08-06 LAB — CBC
HCT: 31.1 — ABNORMAL LOW
Hemoglobin: 10.4 — ABNORMAL LOW
MCHC: 33.3
MCHC: 33.6
MCV: 78.7
MCV: 78.7
Platelets: 206
Platelets: 260
RBC: 3.95
RDW: 15.5
WBC: 12.1 — ABNORMAL HIGH

## 2011-08-06 LAB — URINE MICROSCOPIC-ADD ON

## 2012-09-02 ENCOUNTER — Encounter: Payer: Self-pay | Admitting: Obstetrics and Gynecology

## 2012-11-25 ENCOUNTER — Ambulatory Visit: Payer: Self-pay | Admitting: Obstetrics and Gynecology

## 2013-01-18 ENCOUNTER — Encounter: Payer: Self-pay | Admitting: Obstetrics and Gynecology

## 2013-01-18 ENCOUNTER — Other Ambulatory Visit: Payer: Self-pay | Admitting: Obstetrics and Gynecology

## 2013-01-18 DIAGNOSIS — N912 Amenorrhea, unspecified: Secondary | ICD-10-CM

## 2013-01-18 NOTE — Progress Notes (Signed)
Pt came in for a NOB interview today.  Prior to starting the interview the pt states that she has not had a cycle since the end of January.  Took a pregnancy test and it was positive.  Two days ago the pt says she took another pregnancy test and it was negative.  UPT done here in the office and resulted as negative.  Consulted w/ EP, advised pt that we will do a QHCG today, the results may come back before the end of the day and this RN will call her after review of results and let her know what the plan is.  Pt voices agreement.

## 2013-01-19 LAB — HCG, QUANTITATIVE, PREGNANCY: hCG, Beta Chain, Quant, S: <2 m[IU]/mL

## 2013-01-20 ENCOUNTER — Telehealth: Payer: Self-pay | Admitting: Obstetrics and Gynecology

## 2013-01-20 NOTE — Telephone Encounter (Signed)
Tc to pt regarding msg.  Pt informed qhcg was negative and per EP recommends monitoring cycle for the rest of this month to see her cycle will start.  If it does not, call back the beginning of April and make an appointment.  Pt voices understanding and agrees.

## 2013-04-26 ENCOUNTER — Ambulatory Visit: Payer: Self-pay | Admitting: *Deleted

## 2013-04-27 LAB — OB RESULTS CONSOLE HIV ANTIBODY (ROUTINE TESTING): HIV: NONREACTIVE

## 2013-04-27 LAB — OB RESULTS CONSOLE RPR: RPR: NONREACTIVE

## 2013-04-27 LAB — OB RESULTS CONSOLE RUBELLA ANTIBODY, IGM: RUBELLA: IMMUNE

## 2013-04-27 LAB — OB RESULTS CONSOLE ABO/RH: RH Type: POSITIVE

## 2013-04-27 LAB — OB RESULTS CONSOLE ANTIBODY SCREEN: Antibody Screen: POSITIVE

## 2013-04-27 LAB — OB RESULTS CONSOLE GC/CHLAMYDIA
CHLAMYDIA, DNA PROBE: NEGATIVE
GC PROBE AMP, GENITAL: NEGATIVE

## 2013-04-27 LAB — OB RESULTS CONSOLE HEPATITIS B SURFACE ANTIGEN: HEP B S AG: NEGATIVE

## 2013-05-31 ENCOUNTER — Encounter (HOSPITAL_COMMUNITY): Payer: Self-pay

## 2013-05-31 ENCOUNTER — Inpatient Hospital Stay (HOSPITAL_COMMUNITY)
Admission: AD | Admit: 2013-05-31 | Discharge: 2013-05-31 | Disposition: A | Discharge status: Home / Self Care | Payer: Managed Care, Other (non HMO) | Source: Ambulatory Visit | Attending: Obstetrics and Gynecology | Admitting: Obstetrics and Gynecology

## 2013-05-31 DIAGNOSIS — O210 Mild hyperemesis gravidarum: Secondary | ICD-10-CM | POA: Insufficient documentation

## 2013-05-31 DIAGNOSIS — J3489 Other specified disorders of nose and nasal sinuses: Secondary | ICD-10-CM | POA: Insufficient documentation

## 2013-05-31 DIAGNOSIS — R112 Nausea with vomiting, unspecified: Secondary | ICD-10-CM

## 2013-05-31 LAB — URINALYSIS, ROUTINE W REFLEX MICROSCOPIC
Bilirubin Urine: NEGATIVE
Glucose, UA: NEGATIVE mg/dL
Hgb urine dipstick: NEGATIVE
Ketones, ur: NEGATIVE mg/dL
Protein, ur: NEGATIVE mg/dL
pH: 7.5 (ref 5.0–8.0)

## 2013-05-31 LAB — CBC WITH DIFFERENTIAL/PLATELET
Basophils Absolute: 0 10*3/uL (ref 0.0–0.1)
Basophils Relative: 0 % (ref 0–1)
Eosinophils Absolute: 0.5 10*3/uL (ref 0.0–0.7)
Eosinophils Relative: 5 % (ref 0–5)
HCT: 34.7 % — ABNORMAL LOW (ref 36.0–46.0)
Hemoglobin: 11.7 g/dL — ABNORMAL LOW (ref 12.0–15.0)
MCH: 25.9 pg — ABNORMAL LOW (ref 26.0–34.0)
MCHC: 33.7 g/dL (ref 30.0–36.0)
MCV: 76.8 fL — ABNORMAL LOW (ref 78.0–100.0)
Monocytes Absolute: 0.6 10*3/uL (ref 0.1–1.0)
Monocytes Relative: 7 % (ref 3–12)
Neutro Abs: 5.9 10*3/uL (ref 1.7–7.7)
RDW: 15 % (ref 11.5–15.5)

## 2013-05-31 LAB — COMPREHENSIVE METABOLIC PANEL
Albumin: 3.1 g/dL — ABNORMAL LOW (ref 3.5–5.2)
BUN: 4 mg/dL — ABNORMAL LOW (ref 6–23)
Calcium: 9.1 mg/dL (ref 8.4–10.5)
Chloride: 97 mEq/L (ref 96–112)
Creatinine, Ser: 0.54 mg/dL (ref 0.50–1.10)
Total Bilirubin: 0.3 mg/dL (ref 0.3–1.2)

## 2013-05-31 LAB — LIPASE, BLOOD: Lipase: 25 U/L (ref 11–59)

## 2013-05-31 LAB — AMYLASE: Amylase: 49 U/L (ref 0–105)

## 2013-05-31 MED ORDER — ONDANSETRON 4 MG PO TBDP
4.0000 mg | ORAL_TABLET | Freq: Once | ORAL | Status: DC
  Filled 2013-05-31: qty 1

## 2013-05-31 NOTE — MAU Provider Note (Signed)
History   32yo, [id number] at [redacted]w[redacted]d called the office with c/o N/V for the past 5 days.  She reports trying Zofran, Phenergan, and Reglan with no relief.  Pt also reports nasal congestion.  Denies VB, UCs, recent fever or anyone around her with similar illness, GI c/o's, UTI or PIH s/s.  Chief Complaint  Patient presents with  . Emesis During Pregnancy    OB History   Grav Para Term Preterm Abortions TAB SAB Ect Mult Living   4 1   1  1   2       Past Medical History  Diagnosis Date  . Irregular menses   . Depression   . Bacterial infection     Past Surgical History  Procedure Laterality Date  . Gallbladder surgery    . Dilation and curettage of uterus    . Cesarean section      Family History  Problem Relation Age of Onset  . Hypertension Paternal Grandfather   . Diabetes Father     History  Substance Use Topics  . Smoking status: Never Smoker   . Smokeless tobacco: Not on file  . Alcohol Use: No    Allergies: No Known Allergies  Prescriptions prior to admission  Medication Sig Dispense Refill  . guaiFENesin (MUCINEX) 600 MG 12 hr tablet Take 600 mg by mouth 2 (two) times daily.      . metoCLOPramide (REGLAN) 10 MG tablet Take 10 mg by mouth 4 (four) times daily.      . Prenatal Vit-Fe Fumarate-FA (PRENATAL MULTIVITAMIN) TABS Take 1 tablet by mouth daily at 12 noon.      . pseudoephedrine (SUDAFED) 30 MG tablet Take 30 mg by mouth every 4 (four) hours as needed for congestion.        ROS: see HPI above, all other systems are negative   Physical Exam   Blood pressure 116/60, pulse 98, temperature 97.6 F (36.4 C), resp. rate 16, height 5\' 4"  (1.626 m), weight 251 lb 2 oz (113.91 kg).  Chest: Clear Heart: RRR Abdomen: gravid, NT Extremities: WNL  FHT: Doppler 140    ED Course  IUP at [redacted]w[redacted]d N/V for 5 days Viral vs N/V of pregnancy No Ketones  UA - WNL CBC - WNL CMET - WNL Amylase and Lipase - pending Zofran 34m ODT once - pt refused Offered PO  hydration Suggested Claritin QD for congestion  D/c home with Diclegis Precautions discussed F/u with ROB - needs to be scheduled   Haroldine Laws CNM, MSN 05/31/2013 12:44 PM

## 2013-05-31 NOTE — MAU Note (Signed)
Pt states has had hyperemesis with this pregnancy, however not this severe. Also has headache.

## 2013-05-31 NOTE — MAU Note (Signed)
C/o N&V since last Thursday (past 5 days);

## 2013-11-11 NOTE — L&D Delivery Note (Signed)
Delivery Note At 10:49 AM a viable female was delivered via VBAC, Spontaneous (Presentation: ; Occiput Anterior).  APGAR: 8, 9; weight pending.   Placenta status: sent to pathology d/t meconium staining and delayed separation, .  Cord: 3 vessels with the following complications: None.  Cord pH: Not collected  After 30 minutes placenta remained attached.  Dr. Stefano GaulStringer called to bedside for evaluation.  Waited additional 30 minutes and perineal repairs completed.  Placenta separated and delivered with fundal massage, by Dr. Stefano GaulStringer.  Placenta intact and to pathology.  Anesthesia: Epidural  Episiotomy: None Lacerations: 2nd degree and periurethral  Suture Repair: 3.0 vicryl and 4.0 vicryl Est. Blood Loss (mL): 350  Mom to postpartum.  Baby to Couplet care / Skin to Skin.  Routine PP orders Breastfeeding Placenta sent to pathology  [name], [name] 12/10/2013, 12:09 PM

## 2013-12-03 LAB — OB RESULTS CONSOLE GBS
GBS: NEGATIVE
STREP GROUP B AG: POSITIVE

## 2013-12-10 ENCOUNTER — Inpatient Hospital Stay (HOSPITAL_COMMUNITY): Payer: BC Managed Care – PPO | Admitting: Anesthesiology

## 2013-12-10 ENCOUNTER — Inpatient Hospital Stay (HOSPITAL_COMMUNITY)
Admission: AD | Admit: 2013-12-10 | Discharge: 2013-12-11 | DRG: 774 | Disposition: A | Discharge status: Home / Self Care | Payer: BC Managed Care – PPO | Source: Ambulatory Visit | Attending: Obstetrics and Gynecology | Admitting: Obstetrics and Gynecology

## 2013-12-10 ENCOUNTER — Encounter (HOSPITAL_COMMUNITY): Payer: BC Managed Care – PPO | Admitting: Anesthesiology

## 2013-12-10 ENCOUNTER — Encounter (HOSPITAL_COMMUNITY): Payer: Self-pay

## 2013-12-10 DIAGNOSIS — E669 Obesity, unspecified: Secondary | ICD-10-CM | POA: Diagnosis present

## 2013-12-10 DIAGNOSIS — O9989 Other specified diseases and conditions complicating pregnancy, childbirth and the puerperium: Secondary | ICD-10-CM

## 2013-12-10 DIAGNOSIS — O34219 Maternal care for unspecified type scar from previous cesarean delivery: Secondary | ICD-10-CM | POA: Diagnosis present

## 2013-12-10 DIAGNOSIS — O9902 Anemia complicating childbirth: Secondary | ICD-10-CM | POA: Diagnosis present

## 2013-12-10 DIAGNOSIS — O4590 Premature separation of placenta, unspecified, unspecified trimester: Secondary | ICD-10-CM | POA: Diagnosis present

## 2013-12-10 DIAGNOSIS — D649 Anemia, unspecified: Secondary | ICD-10-CM | POA: Diagnosis present

## 2013-12-10 DIAGNOSIS — O99892 Other specified diseases and conditions complicating childbirth: Secondary | ICD-10-CM | POA: Diagnosis present

## 2013-12-10 DIAGNOSIS — O99214 Obesity complicating childbirth: Secondary | ICD-10-CM

## 2013-12-10 DIAGNOSIS — Z22330 Carrier of Group B streptococcus: Secondary | ICD-10-CM

## 2013-12-10 DIAGNOSIS — IMO0002 Reserved for concepts with insufficient information to code with codable children: Secondary | ICD-10-CM | POA: Diagnosis present

## 2013-12-10 DIAGNOSIS — J40 Bronchitis, not specified as acute or chronic: Secondary | ICD-10-CM | POA: Diagnosis present

## 2013-12-10 LAB — CBC
HCT: 34.8 % — ABNORMAL LOW (ref 36.0–46.0)
HEMOGLOBIN: 11.4 g/dL — AB (ref 12.0–15.0)
MCH: 24.7 pg — AB (ref 26.0–34.0)
MCHC: 32.8 g/dL (ref 30.0–36.0)
MCV: 75.5 fL — AB (ref 78.0–100.0)
Platelets: 223 10*3/uL (ref 150–400)
RBC: 4.61 MIL/uL (ref 3.87–5.11)
RDW: 17.2 % — ABNORMAL HIGH (ref 11.5–15.5)
WBC: 10.8 10*3/uL — ABNORMAL HIGH (ref 4.0–10.5)

## 2013-12-10 LAB — PREPARE RBC (CROSSMATCH)

## 2013-12-10 LAB — POCT FERN TEST: POCT Fern Test: POSITIVE

## 2013-12-10 LAB — RPR: RPR Ser Ql: NONREACTIVE

## 2013-12-10 MED ORDER — LACTATED RINGERS IV SOLN
500.0000 mL | INTRAVENOUS | Status: DC | PRN
  Administered 2013-12-10: 500 mL via INTRAVENOUS

## 2013-12-10 MED ORDER — SIMETHICONE 80 MG PO CHEW: 80.0000 mg | CHEWABLE_TABLET | ORAL | Status: DC | PRN

## 2013-12-10 MED ORDER — CITRIC ACID-SODIUM CITRATE 334-500 MG/5ML PO SOLN
30.0000 mL | ORAL | Status: DC | PRN
  Filled 2013-12-10: qty 15

## 2013-12-10 MED ORDER — IBUPROFEN 600 MG PO TABS
600.0000 mg | ORAL_TABLET | Freq: Four times a day (QID) | ORAL | Status: DC
  Administered 2013-12-10 – 2013-12-11 (×4): 600 mg via ORAL
  Filled 2013-12-10 (×4): qty 1

## 2013-12-10 MED ORDER — OXYCODONE-ACETAMINOPHEN 5-325 MG PO TABS
1.0000 | ORAL_TABLET | ORAL | Status: DC | PRN
  Administered 2013-12-10: 1 via ORAL
  Administered 2013-12-11 (×2): 2 via ORAL
  Filled 2013-12-10: qty 1
  Filled 2013-12-10 (×2): qty 2

## 2013-12-10 MED ORDER — SENNOSIDES-DOCUSATE SODIUM 8.6-50 MG PO TABS
2.0000 | ORAL_TABLET | ORAL | Status: DC
  Administered 2013-12-11: 2 via ORAL
  Filled 2013-12-10: qty 2

## 2013-12-10 MED ORDER — LANOLIN HYDROUS EX OINT: TOPICAL_OINTMENT | CUTANEOUS | Status: DC | PRN

## 2013-12-10 MED ORDER — LIDOCAINE HCL (PF) 1 % IJ SOLN
INTRAMUSCULAR | Status: DC | PRN
  Administered 2013-12-10 (×2): 9 mL

## 2013-12-10 MED ORDER — FENTANYL 2.5 MCG/ML BUPIVACAINE 1/10 % EPIDURAL INFUSION (WH - ANES)
14.0000 mL/h | INTRAMUSCULAR | Status: DC | PRN
  Administered 2013-12-10: 14 mL/h via EPIDURAL
  Filled 2013-12-10 (×2): qty 125

## 2013-12-10 MED ORDER — PRENATAL MULTIVITAMIN CH
1.0000 | ORAL_TABLET | Freq: Every day | ORAL | Status: DC
  Administered 2013-12-11: 1 via ORAL
  Filled 2013-12-10: qty 1

## 2013-12-10 MED ORDER — BENZOCAINE-MENTHOL 20-0.5 % EX AERO
1.0000 "application " | INHALATION_SPRAY | CUTANEOUS | Status: DC | PRN
  Administered 2013-12-10: 1 via TOPICAL

## 2013-12-10 MED ORDER — OXYTOCIN 40 UNITS IN LACTATED RINGERS INFUSION - SIMPLE MED
62.5000 mL/h | INTRAVENOUS | Status: DC
  Administered 2013-12-10: 999 mL/h via INTRAVENOUS
  Filled 2013-12-10: qty 1000

## 2013-12-10 MED ORDER — OXYTOCIN BOLUS FROM INFUSION: 500.0000 mL | INTRAVENOUS | Status: DC

## 2013-12-10 MED ORDER — DIPHENHYDRAMINE HCL 50 MG/ML IJ SOLN: 12.5000 mg | INTRAMUSCULAR | Status: DC | PRN

## 2013-12-10 MED ORDER — LACTATED RINGERS IV SOLN: 500.0000 mL | Freq: Once | INTRAVENOUS | Status: DC

## 2013-12-10 MED ORDER — ACETAMINOPHEN 325 MG PO TABS: 650.0000 mg | ORAL_TABLET | ORAL | Status: DC | PRN

## 2013-12-10 MED ORDER — IBUPROFEN 600 MG PO TABS: 600.0000 mg | ORAL_TABLET | Freq: Four times a day (QID) | ORAL | Status: DC | PRN

## 2013-12-10 MED ORDER — EPHEDRINE 5 MG/ML INJ
10.0000 mg | INTRAVENOUS | Status: DC | PRN
  Filled 2013-12-10: qty 2
  Filled 2013-12-10: qty 4

## 2013-12-10 MED ORDER — FERROUS SULFATE 325 (65 FE) MG PO TABS
325.0000 mg | ORAL_TABLET | Freq: Two times a day (BID) | ORAL | Status: DC
  Administered 2013-12-10 – 2013-12-11 (×2): 325 mg via ORAL
  Filled 2013-12-10 (×3): qty 1

## 2013-12-10 MED ORDER — EPHEDRINE 5 MG/ML INJ
10.0000 mg | INTRAVENOUS | Status: DC | PRN
  Filled 2013-12-10: qty 2

## 2013-12-10 MED ORDER — ONDANSETRON HCL 4 MG/2ML IJ SOLN
4.0000 mg | Freq: Four times a day (QID) | INTRAMUSCULAR | Status: DC | PRN
  Filled 2013-12-10: qty 2

## 2013-12-10 MED ORDER — LACTATED RINGERS IV SOLN
INTRAVENOUS | Status: DC
  Administered 2013-12-10: 08:00:00 via INTRAVENOUS

## 2013-12-10 MED ORDER — WITCH HAZEL-GLYCERIN EX PADS: 1.0000 "application " | MEDICATED_PAD | CUTANEOUS | Status: DC | PRN

## 2013-12-10 MED ORDER — ONDANSETRON HCL 4 MG PO TABS: 4.0000 mg | ORAL_TABLET | ORAL | Status: DC | PRN

## 2013-12-10 MED ORDER — DIBUCAINE 1 % RE OINT
1.0000 | TOPICAL_OINTMENT | RECTAL | Status: DC | PRN
Start: 2013-12-10 — End: 2013-12-11

## 2013-12-10 MED ORDER — OXYCODONE-ACETAMINOPHEN 5-325 MG PO TABS: 1.0000 | ORAL_TABLET | ORAL | Status: DC | PRN

## 2013-12-10 MED ORDER — TETANUS-DIPHTH-ACELL PERTUSSIS 5-2.5-18.5 LF-MCG/0.5 IM SUSP: 0.5000 mL | Freq: Once | INTRAMUSCULAR | Status: DC

## 2013-12-10 MED ORDER — DIPHENHYDRAMINE HCL 25 MG PO CAPS: 25.0000 mg | ORAL_CAPSULE | Freq: Four times a day (QID) | ORAL | Status: DC | PRN

## 2013-12-10 MED ORDER — LIDOCAINE HCL (PF) 1 % IJ SOLN
30.0000 mL | INTRAMUSCULAR | Status: DC | PRN
  Administered 2013-12-10: 30 mL via SUBCUTANEOUS
  Filled 2013-12-10 (×2): qty 30

## 2013-12-10 MED ORDER — PHENYLEPHRINE 40 MCG/ML (10ML) SYRINGE FOR IV PUSH (FOR BLOOD PRESSURE SUPPORT)
80.0000 ug | PREFILLED_SYRINGE | INTRAVENOUS | Status: DC | PRN
  Filled 2013-12-10: qty 2
  Filled 2013-12-10: qty 10

## 2013-12-10 MED ORDER — ONDANSETRON HCL 4 MG/2ML IJ SOLN: 4.0000 mg | INTRAMUSCULAR | Status: DC | PRN

## 2013-12-10 MED ORDER — FENTANYL 2.5 MCG/ML BUPIVACAINE 1/10 % EPIDURAL INFUSION (WH - ANES)
INTRAMUSCULAR | Status: DC | PRN
  Administered 2013-12-10: 14 mL/h via EPIDURAL

## 2013-12-10 MED ORDER — PHENYLEPHRINE 40 MCG/ML (10ML) SYRINGE FOR IV PUSH (FOR BLOOD PRESSURE SUPPORT)
80.0000 ug | PREFILLED_SYRINGE | INTRAVENOUS | Status: DC | PRN
  Filled 2013-12-10: qty 2

## 2013-12-10 MED ORDER — ZOLPIDEM TARTRATE 5 MG PO TABS: 5.0000 mg | ORAL_TABLET | Freq: Every evening | ORAL | Status: DC | PRN

## 2013-12-10 NOTE — Progress Notes (Signed)
  Subjective: Resting in bed. Comfortable with epidural in place.  SO at bedside.  Objective: BP 103/55  Pulse 89  Temp(Src) 98.3 F (36.8 C) (Oral)  Resp 20  Ht 5\' 4"  (1.626 m)  Wt 263 lb (119.296 kg)  BMI 45.12 kg/m2  SpO2 98%      FHT:  Cat I UC:   regular, every 1-7 minutes SVE:   Dilation: 4 Effacement (%): 100 Station: -1 Exam by:: AL Rinehart Rn  Assessment / Plan: Spontaneous labor, progressing normally  Labor: Progressing normally  Preeclampsia: no signs or symptoms of toxicity  Fetal Wellbeing: Category I  Pain Control: Epidural  I/D: GBS Negative, SROM @ 0230, Afebrile  Anticipated MOD: NSVD  Estephanie Hubbs 12/10/2013, 8:34 AM

## 2013-12-10 NOTE — Anesthesia Procedure Notes (Signed)
Epidural Patient location during procedure: OB Start time: 12/10/2013 5:12 AM End time: 12/10/2013 5:17 AM  Staffing Anesthesiologist: Leilani AbleHATCHETT, Maysie Parkhill Performed by: anesthesiologist   Preanesthetic Checklist Completed: patient identified, surgical consent, pre-op evaluation, timeout performed, IV checked, risks and benefits discussed and monitors and equipment checked  Epidural Patient position: sitting Prep: site prepped and draped and DuraPrep Patient monitoring: continuous pulse ox and blood pressure Approach: midline Injection technique: LOR air  Needle:  Needle type: Tuohy  Needle gauge: 17 G Needle length: 9 cm and 9 Needle insertion depth: 8 cm Catheter type: closed end flexible Catheter size: 19 Gauge Catheter at skin depth: 13 cm Test dose: negative and Other  Assessment Sensory level: T9 Events: blood not aspirated, injection not painful, no injection resistance, negative IV test and no paresthesia  Additional Notes Reason for block:procedure for pain

## 2013-12-10 NOTE — Anesthesia Postprocedure Evaluation (Signed)
  Anesthesia Post-op Note  Patient: [name]  Procedure(s) Performed: * No procedures listed *  Patient Location: Mother/Baby  Anesthesia Type:Epidural  Level of Consciousness: awake  Airway and Oxygen Therapy: Patient Spontanous Breathing  Post-op Pain: none  Post-op Assessment: Patient's Cardiovascular Status Stable, Respiratory Function Stable, Patent Airway, No signs of Nausea or vomiting, Adequate PO intake, Pain level controlled, No headache, No backache, No residual numbness and No residual motor weakness  Post-op Vital Signs: Reviewed and stable  Complications: No apparent anesthesia complications

## 2013-12-10 NOTE — Anesthesia Preprocedure Evaluation (Signed)
Anesthesia Evaluation  Patient identified by MRN, date of birth, ID band Patient awake    Reviewed: Allergy & Precautions, H&P , NPO status , Patient's Chart, lab work & pertinent test results  Airway Mallampati: II  TM Distance: >3 FB Neck ROM: full    Dental no notable dental hx.    Pulmonary neg pulmonary ROS,    Pulmonary exam normal       Cardiovascular negative cardio ROS      Neuro/Psych negative neurological ROS     GI/Hepatic negative GI ROS, Neg liver ROS,   Endo/Other  Morbid obesity  Renal/GU negative Renal ROS     Musculoskeletal   Abdominal (+) + obese,   Peds  Hematology negative hematology ROS (+)   Anesthesia Other Findings   Reproductive/Obstetrics (+) Pregnancy                             Anesthesia Physical Anesthesia Plan  ASA: III  Anesthesia Plan: Epidural   Post-op Pain Management:    Induction:   Airway Management Planned:   Additional Equipment:   Intra-op Plan:   Post-operative Plan:   Informed Consent: I have reviewed the patients History and Physical, chart, labs and discussed the procedure including the risks, benefits and alternatives for the proposed anesthesia with the patient or authorized representative who has indicated his/her understanding and acceptance.     Plan Discussed with:   Anesthesia Plan Comments:         Anesthesia Quick Evaluation  

## 2013-12-10 NOTE — H&P (Signed)
[name] is a 33 y.o. female presenting for SROM/labor. Reports gush of fluid about 3am, with onset of ctx. Denies any VB, +FM.   Pregnancy course: Began PNC at 9wks at [facility] Struggled w NVP Noted to have +AB screen Quad screen nl Anatomy scan at 19wks w f/u 22wks, WNL Normal early 1hr glucola and f/u normal  GBS neg   Maternal Medical History:  Reason for admission: Rupture of membranes, contractions and nausea.   Contractions: Onset was 3-5 hours ago.    Fetal activity: Perceived fetal activity is normal.   Last perceived fetal movement was within the past hour.    Prenatal complications: no prenatal complications Prenatal Complications - Diabetes: none.    OB History   Grav Para Term Preterm Abortions TAB SAB Ect Mult Living   4 2 2  1  1   2      Past Medical History  Diagnosis Date  . Irregular menses   . Depression   . Bacterial infection    Past Surgical History  Procedure Laterality Date  . Gallbladder surgery    . Dilation and curettage of uterus    . Cesarean section     Family History: family history includes Diabetes in her father; Hypertension in her paternal grandfather. Social History:  reports that she has never smoked. She does not have any smokeless tobacco history on file. She reports that she does not drink alcohol or use illicit drugs.   Prenatal Transfer Tool  Maternal Diabetes: No Genetic Screening: Normal Maternal Ultrasounds/Referrals: Normal Fetal Ultrasounds or other Referrals:  None Maternal Substance Abuse:  No Significant Maternal Medications:  None Significant Maternal Lab Results:  Lab values include: Group B Strep negative Other Comments:  None  Review of Systems  Gastrointestinal: Positive for nausea.  Neurological: Positive for headaches.  All other systems reviewed and are negative.    Dilation: 4 Effacement (%): 100 Station: -1 Exam by:: AL Rinehart Rn Blood pressure 104/39, pulse 87, temperature 98.5 F  (36.9 C), temperature source Oral, resp. rate 20, height 5\' 4"  (1.626 m), weight 263 lb (119.296 kg), SpO2 100.00%. Maternal Exam:  Uterine Assessment: Contraction strength is moderate.  Contraction frequency is regular.   Abdomen: Patient reports no abdominal tenderness. Fundal height is aga.   Estimated fetal weight is 8#.   Fetal presentation: vertex  Introitus: Normal vulva. Normal vagina.  Ferning test: positive.  Amniotic fluid character: clear.  Pelvis: adequate for delivery.   Cervix: Cervix evaluated by digital exam.     Fetal Exam Fetal Monitor Review: Mode: ultrasound.    Fetal State Assessment: Category I - tracings are normal.     Physical Exam  Nursing note and vitals reviewed. Constitutional: She is oriented to person, place, and time. She appears well-developed and well-nourished.  HENT:  Head: Normocephalic.  Eyes: Pupils are equal, round, and reactive to light.  Neck: Normal range of motion.  Cardiovascular: Normal rate, regular rhythm and normal heart sounds.   Respiratory: Effort normal and breath sounds normal.  GI: Soft. Bowel sounds are normal.  Genitourinary: Vagina normal.  Musculoskeletal: Normal range of motion.  Neurological: She is alert and oriented to person, place, and time. She has normal reflexes.  Skin: Skin is warm and dry.  Psychiatric: She has a normal mood and affect. Her behavior is normal.    Prenatal labs: ABO, Rh: O/Positive/-- (06/17 0000) Antibody: Positive (06/17 0000) Rubella: Immune (06/17 0000) RPR: Nonreactive (06/17 0000)  HBsAg: Negative (06/17 0000)  HIV: Non-reactive (06/17 0000)  GBS: Positive, Negative (01/23 0000) NEG   Assessment/Plan: IUP at [redacted]w[redacted]d SROM Early labor FHR cat 1 GBS neg Pos AB screen Hx c-section, desires TOLAC  Admit to b.s per c/w Dr Estanislado Pandy Routine L&D orders Expectant mgmnt Epidural  Type and crossmatch     Valree Feild M 12/10/2013, 6:02 AM

## 2013-12-10 NOTE — MAU Note (Signed)
Leaking yellowish fluid since 230 this morning. Contractions worse since then. Positive fetal movement.

## 2013-12-11 LAB — TYPE AND SCREEN
ABO/RH(D): O POS
Antibody Screen: POSITIVE
DAT, IGG: NEGATIVE
UNIT DIVISION: 0
Unit division: 0

## 2013-12-11 LAB — CBC
HEMATOCRIT: 26.9 % — AB (ref 36.0–46.0)
HEMOGLOBIN: 8.8 g/dL — AB (ref 12.0–15.0)
MCH: 24.9 pg — ABNORMAL LOW (ref 26.0–34.0)
MCHC: 32.3 g/dL (ref 30.0–36.0)
MCV: 76.9 fL — ABNORMAL LOW (ref 78.0–100.0)
Platelets: 180 10*3/uL (ref 150–400)
RBC: 3.5 MIL/uL — ABNORMAL LOW (ref 3.87–5.11)
RDW: 17.4 % — AB (ref 11.5–15.5)
WBC: 11.6 10*3/uL — AB (ref 4.0–10.5)

## 2013-12-11 MED ORDER — OXYCODONE-ACETAMINOPHEN 5-325 MG PO TABS: 1.0000 | ORAL_TABLET | Freq: Four times a day (QID) | ORAL | Status: DC | PRN

## 2013-12-11 MED ORDER — IBUPROFEN 600 MG PO TABS
600.0000 mg | ORAL_TABLET | Freq: Four times a day (QID) | ORAL | Status: DC
Start: 2013-12-11 — End: 2016-04-11

## 2013-12-11 MED ORDER — AZITHROMYCIN 250 MG PO TABS: ORAL_TABLET | ORAL | Status: DC

## 2013-12-11 MED ORDER — FERROUS SULFATE 325 (65 FE) MG PO TABS: 325.0000 mg | ORAL_TABLET | Freq: Two times a day (BID) | ORAL | Status: DC

## 2013-12-11 NOTE — Progress Notes (Signed)
Post Partum Day 1 Subjective: States she feels well.  Ambulating, voiding and tol po liquids and solids without difficulty.  Denies weakness or dizziness.  Breastfeeding without difficulty.  Requests discharge to home and also requests z pack that was to be called in prior to admission due to persistent cough for the past 2 weeks which is productive of clear mucus. States perineal pain well controlled with meds.  Objective: Blood pressure 127/84, pulse 98, temperature 97.4 F (36.3 C), temperature source Oral, resp. rate 18, height 5\' 4"  (1.626 m), weight 119.296 kg (263 lb), SpO2 98.00%, unknown if currently breastfeeding. Filed Vitals:   12/11/13 0600 12/11/13 1005 12/11/13 1007 12/11/13 1009  BP: 107/72 114/71 127/83 127/84  Pulse: 94 90 103 98  Temp: 97.4 F (36.3 C)     TempSrc: Oral     Resp: 18 18 18 18   Height:      Weight:      SpO2:       Physical Exam:  General: alert, cooperative and no distress Heart: RRR Lungs:  CTA bilat but pt noted to be coughing c/w bronchitis Breasts:  Soft Abd:  Soft, NT with pos BS x 4 quads Lochia: appropriate, sm rubra Uterine Fundus: firm, NT at umb Incision: Perineum well approximated and healing DVT Evaluation: No evidence of DVT seen on physical exam. Negative Homan's sign bilat No significant calf/ankle edema.   Recent Labs  12/10/13 0425 12/11/13 0602  HGB 11.4* 8.8*  HCT 34.8* 26.9*    Assessment/Plan: Stable s/p vag del at 39w 6d Asymptomatic anemia and stable orthostatic vital signs Bronchitis  Discharge to home Discharge instructions reviewed. Rx Motrin, Percocet, Iron, Z-pack and resume PNV RTO 4wks for f/u Plans Nexplanon for contraception   LOS: 1 day   Cheng Dec O. 12/11/2013, 12:01 PM

## 2013-12-11 NOTE — Discharge Instructions (Signed)
See practice brochure °

## 2013-12-11 NOTE — Discharge Summary (Signed)
Obstetric Discharge Summary Reason for Admission: onset of labor Prenatal Procedures: ultrasound Intrapartum Procedures: spontaneous vaginal delivery Postpartum Procedures: none Complications-Operative and Postpartum: 2nd degree perineal laceration Hemoglobin  Date Value Range Status  12/11/2013 8.8* 12.0 - 15.0 g/dL Final     REPEATED TO VERIFY     DELTA CHECK NOTED     HCT  Date Value Range Status  12/11/2013 26.9* 36.0 - 46.0 % Final   Filed Vitals:   12/11/13 0600 12/11/13 1005 12/11/13 1007 12/11/13 1009  BP: 107/72 114/71 127/83 127/84  Pulse: 94 90 103 98  Temp: 97.4 F (36.3 C)     TempSrc: Oral     Resp: 18 18 18 18   Height:      Weight:      SpO2:       Physical Exam:  General: alert, cooperative and no distress  Heart: RRR  Lungs: CTA bilat but pt noted to be coughing c/w bronchitis  Breasts: Soft  Abd: Soft, NT with pos BS x 4 quads  Lochia: appropriate, sm rubra  Uterine Fundus: firm, NT at umb  Incision: Perineum well approximated and healing  DVT Evaluation: No evidence of DVT seen on physical exam.  Negative Homan's sign bilat  No significant calf/ankle edema.  Discharge Diagnoses: Term Pregnancy-delivered                   Asymptomatic anemia                                         Bronchitis  Discharge Information: Date: 12/11/2013 Activity: unrestricted Diet: routine Medications: PNV, Ibuprofen, Iron, Percocet and Azithromycin Condition: stable Instructions: refer to practice specific booklet Discharge to: home Contraception:  Plans Nexplanon Follow-up Information   Follow up with [facility]. Schedule an appointment as soon as possible for a visit in 4 weeks.   Contact information:   [address], [address] [place] [id number]       Newborn Data: Live born female  Birth Weight: 6 lb 9.8 oz (3000 g) APGAR: 8, 9  Home with mother.  Hellon Vaccarella O. 12/11/2013, 12:29 PM

## 2014-09-12 ENCOUNTER — Encounter (HOSPITAL_COMMUNITY): Payer: Self-pay

## 2014-12-23 ENCOUNTER — Other Ambulatory Visit (INDEPENDENT_AMBULATORY_CARE_PROVIDER_SITE_OTHER): Payer: Self-pay | Admitting: Surgery

## 2014-12-23 ENCOUNTER — Other Ambulatory Visit (INDEPENDENT_AMBULATORY_CARE_PROVIDER_SITE_OTHER): Payer: Self-pay

## 2015-01-11 ENCOUNTER — Encounter (HOSPITAL_COMMUNITY): Admission: RE | Payer: Self-pay | Source: Ambulatory Visit

## 2015-01-11 ENCOUNTER — Ambulatory Visit (HOSPITAL_COMMUNITY): Admission: RE | Admit: 2015-01-11 | Payer: Managed Care, Other (non HMO) | Source: Ambulatory Visit | Admitting: Surgery

## 2015-01-11 ENCOUNTER — Ambulatory Visit (HOSPITAL_COMMUNITY): Payer: Managed Care, Other (non HMO)

## 2015-01-11 SURGERY — BREATH TEST, FOR HELICOBACTER PYLORI

## 2015-01-25 ENCOUNTER — Ambulatory Visit: Payer: Managed Care, Other (non HMO) | Admitting: Dietician

## 2016-04-11 ENCOUNTER — Encounter (HOSPITAL_COMMUNITY): Payer: Self-pay | Admitting: Emergency Medicine

## 2016-04-11 ENCOUNTER — Emergency Department (HOSPITAL_COMMUNITY)
Admission: EM | Admit: 2016-04-11 | Discharge: 2016-04-11 | Disposition: A | Discharge status: Home / Self Care | Payer: BLUE CROSS/BLUE SHIELD | Attending: Emergency Medicine | Admitting: Emergency Medicine

## 2016-04-11 DIAGNOSIS — F329 Major depressive disorder, single episode, unspecified: Secondary | ICD-10-CM | POA: Insufficient documentation

## 2016-04-11 DIAGNOSIS — Y929 Unspecified place or not applicable: Secondary | ICD-10-CM | POA: Insufficient documentation

## 2016-04-11 DIAGNOSIS — X58XXXA Exposure to other specified factors, initial encounter: Secondary | ICD-10-CM | POA: Diagnosis not present

## 2016-04-11 DIAGNOSIS — M62838 Other muscle spasm: Secondary | ICD-10-CM | POA: Diagnosis not present

## 2016-04-11 DIAGNOSIS — Y999 Unspecified external cause status: Secondary | ICD-10-CM | POA: Insufficient documentation

## 2016-04-11 DIAGNOSIS — Y939 Activity, unspecified: Secondary | ICD-10-CM | POA: Insufficient documentation

## 2016-04-11 DIAGNOSIS — S199XXA Unspecified injury of neck, initial encounter: Secondary | ICD-10-CM | POA: Diagnosis present

## 2016-04-11 MED ORDER — IBUPROFEN 800 MG PO TABS: 800.0000 mg | ORAL_TABLET | Freq: Three times a day (TID) | ORAL | Status: DC

## 2016-04-11 MED ORDER — RANITIDINE HCL 150 MG PO TABS: 150.0000 mg | ORAL_TABLET | Freq: Two times a day (BID) | ORAL | Status: DC

## 2016-04-11 MED ORDER — DIAZEPAM 5 MG PO TABS: 2.5000 mg | ORAL_TABLET | Freq: Four times a day (QID) | ORAL | Status: DC | PRN

## 2016-04-11 MED ORDER — ACETAMINOPHEN 500 MG PO TABS: 1000.0000 mg | ORAL_TABLET | Freq: Four times a day (QID) | ORAL | Status: DC | PRN

## 2016-04-11 NOTE — Discharge Instructions (Signed)
Take 4 over the counter ibuprofen tablets 3 times a day or 2 over-the-counter naproxen tablets twice a day for pain. °Tylenol 1-2 tabs po q4h prn ° °Muscle Strain °A muscle strain is an injury that occurs when a muscle is stretched beyond its normal length. Usually a small number of muscle fibers are torn when this happens. Muscle strain is rated in degrees. First-degree strains have the least amount of muscle fiber tearing and pain. Second-degree and third-degree strains have increasingly more tearing and pain.  °Usually, recovery from muscle strain takes 1-2 weeks. Complete healing takes 5-6 weeks.  °CAUSES  °Muscle strain happens when a sudden, violent force placed on a muscle stretches it too far. This may occur with lifting, sports, or a fall.  °RISK FACTORS °Muscle strain is especially common in athletes.  °SIGNS AND SYMPTOMS °At the site of the muscle strain, there may be: °· Pain. °· Bruising. °· Swelling. °· Difficulty using the muscle due to pain or lack of normal function. °DIAGNOSIS  °Your health care provider will perform a physical exam and ask about your medical history. °TREATMENT  °Often, the best treatment for a muscle strain is resting, icing, and applying cold compresses to the injured area.   °HOME CARE INSTRUCTIONS  °· Use the PRICE method of treatment to promote muscle healing during the first 2-3 days after your injury. The PRICE method involves: °¨ Protecting the muscle from being injured again. °¨ Restricting your activity and resting the injured body part. °¨ Icing your injury. To do this, put ice in a plastic bag. Place a towel between your skin and the bag. Then, apply the ice and leave it on from 15-20 minutes each hour. After the third day, switch to moist heat packs. °¨ Apply compression to the injured area with a splint or elastic bandage. Be careful not to wrap it too tightly. This may interfere with blood circulation or increase swelling. °¨ Elevate the injured body part above the  level of your heart as often as you can. °· Only take over-the-counter or prescription medicines for pain, discomfort, or fever as directed by your health care provider. °· Warming up prior to exercise helps to prevent future muscle strains. °SEEK MEDICAL CARE IF:  °· You have increasing pain or swelling in the injured area. °· You have numbness, tingling, or a significant loss of strength in the injured area. °MAKE SURE YOU:  °· Understand these instructions. °· Will watch your condition. °· Will get help right away if you are not doing well or get worse. °  °This information is not intended to replace advice given to you by your health care provider. Make sure you discuss any questions you have with your health care provider. °  °Document Released: 10/28/2005 Document Revised: 08/18/2013 Document Reviewed: 05/27/2013 °Elsevier Interactive Patient Education ©2016 [employer] ° °

## 2016-04-11 NOTE — ED Provider Notes (Signed)
CSN: [id number]     Arrival date & time 04/11/16  0820 History   First MD Initiated Contact with Patient 04/11/16 0845     Chief Complaint  Patient presents with  . Shoulder Pain  . Neck Injury     (Consider location/radiation/quality/duration/timing/severity/associated sxs/prior Treatment) Patient is a 35 y.o. female presenting with shoulder pain, neck injury, and general illness. The history is provided by the patient.  Shoulder Pain Associated symptoms: no fever   Neck Injury Pertinent negatives include no chest pain, no headaches and no shortness of breath.  Illness Severity:  Moderate Onset quality:  Sudden Duration:  2 days Timing:  Constant Progression:  Worsening Chronicity:  New Associated symptoms: myalgias   Associated symptoms: no chest pain, no congestion, no fever, no headaches, no nausea, no rhinorrhea, no shortness of breath, no vomiting and no wheezing    35 yo F with R shoulder pain. Going on for past couple of days. Thinks slept on in wrong.  Worse with movement, palpation.  Also been complaining of off and on epigastric pain. Having some nausea with this. No current symptoms with this usually worse with eating. Denies right upper quadrant pain. Denies fevers or chills.  Past Medical History  Diagnosis Date  . Irregular menses   . Depression   . Bacterial infection    Past Surgical History  Procedure Laterality Date  . Gallbladder surgery    . Dilation and curettage of uterus    . Cesarean section     Family History  Problem Relation Age of Onset  . Hypertension Paternal Grandfather   . Diabetes Father    Social History  Substance Use Topics  . Smoking status: Never Smoker   . Smokeless tobacco: None  . Alcohol Use: No   OB History    Gravida Para Term Preterm AB TAB SAB Ectopic Multiple Living   4 3 3  1  1   3      Review of Systems  Constitutional: Negative for fever and chills.  HENT: Negative for congestion and rhinorrhea.   Eyes:  Negative for redness and visual disturbance.  Respiratory: Negative for shortness of breath and wheezing.   Cardiovascular: Negative for chest pain and palpitations.  Gastrointestinal: Negative for nausea and vomiting.  Genitourinary: Negative for dysuria and urgency.  Musculoskeletal: Positive for myalgias and arthralgias.  Skin: Negative for pallor and wound.  Neurological: Negative for dizziness and headaches.      Allergies  Review of patient's allergies indicates no known allergies.  Home Medications   Prior to Admission medications   Medication Sig Start Date End Date Taking? Authorizing Provider  acetaminophen (TYLENOL) 500 MG tablet Take 2 tablets (1,000 mg total) by mouth every 6 (six) hours as needed. 04/11/16   Melene Plan, DO  azithromycin (ZITHROMAX Z-PAK) 250 MG tablet Take 2 tabs today and then 1 tab daily for 4 days until all medication is completed. 12/11/13   Elsie Ra, CNM  diazepam (VALIUM) 5 MG tablet Take 0.5 tablets (2.5 mg total) by mouth every 6 (six) hours as needed for anxiety (spasms). 04/11/16   Melene Plan, DO  ferrous sulfate 325 (65 FE) MG tablet Take 1 tablet (325 mg total) by mouth 2 (two) times daily with a meal. 12/11/13   Elsie Ra, CNM  guaiFENesin (MUCINEX) 600 MG 12 hr tablet Take 600 mg by mouth 2 (two) times daily.    Historical Provider, MD  ibuprofen (ADVIL,MOTRIN) 800 MG tablet Take 1 tablet (800 mg  total) by mouth 3 (three) times daily. 04/11/16   Melene Plan, DO  oxyCODONE-acetaminophen (PERCOCET/ROXICET) 5-325 MG per tablet Take 1-2 tablets by mouth every 6 (six) hours as needed for severe pain (moderate - severe pain). 12/11/13   Elsie Ra, CNM  Prenatal Vit-Fe Fumarate-FA (PRENATAL MULTIVITAMIN) TABS Take 1 tablet by mouth daily at 12 noon.    Historical Provider, MD  ranitidine (ZANTAC) 150 MG tablet Take 1 tablet (150 mg total) by mouth 2 (two) times daily. 04/11/16   Melene Plan, DO   BP 117/97 mmHg  Pulse 85  Temp(Src) 98 F (36.7 C) (Oral)  Resp  19  SpO2 100% Physical Exam  Constitutional: She is oriented to person, place, and time. She appears well-developed and well-nourished. No distress.  HENT:  Head: Normocephalic and atraumatic.  Eyes: EOM are normal. Pupils are equal, round, and reactive to light.  Neck: Normal range of motion. Neck supple.  Cardiovascular: Normal rate and regular rhythm.  Exam reveals no gallop and no friction rub.   No murmur heard. Pulmonary/Chest: Effort normal. She has no wheezes. She has no rales.  Abdominal: Soft. She exhibits no distension. There is no tenderness.  Musculoskeletal: She exhibits tenderness. She exhibits no edema.  Pain to R trapezius.  PMS intact distally.   Neurological: She is alert and oriented to person, place, and time.  Skin: Skin is warm and dry. She is not diaphoretic.  Psychiatric: She has a normal mood and affect. Her behavior is normal.  Nursing note and vitals reviewed.   ED Course  Procedures (including critical care time) Labs Review Labs Reviewed - No data to display  Imaging Review No results found. I have personally reviewed and evaluated these images and lab results as part of my medical decision-making.   EKG Interpretation None      MDM   Final diagnoses:  Trapezius muscle spasm    35 yo F With a chief complaint of right neck pain. This appears to be trapezius spasm. Doubt any bony injury. Placed in sling. Tylenol ibuprofen. Patient also complaining of some significant reflux symptoms. Saying that she sometimes feels like she has trouble eating. We'll have her follow-up with gastroenterology for possible scope.  9:38 AM:  I have discussed the diagnosis/risks/treatment options with the patient and believe the pt to be eligible for discharge home to follow-up with PCP, GI. We also discussed returning to the ED immediately if new or worsening sx occur. We discussed the sx which are most concerning (e.g., sudden worsening pain, fever, inability to  tolerate by mouth) that necessitate immediate return. Medications administered to the patient during their visit and any new prescriptions provided to the patient are listed below.  Medications given during this visit Medications - No data to display  New Prescriptions   ACETAMINOPHEN (TYLENOL) 500 MG TABLET    Take 2 tablets (1,000 mg total) by mouth every 6 (six) hours as needed.   DIAZEPAM (VALIUM) 5 MG TABLET    Take 0.5 tablets (2.5 mg total) by mouth every 6 (six) hours as needed for anxiety (spasms).   IBUPROFEN (ADVIL,MOTRIN) 800 MG TABLET    Take 1 tablet (800 mg total) by mouth 3 (three) times daily.   RANITIDINE (ZANTAC) 150 MG TABLET    Take 1 tablet (150 mg total) by mouth 2 (two) times daily.    The patient appears reasonably screen and/or stabilized for discharge and I doubt any other medical condition or other [facility] requiring further screening, evaluation,  or treatment in the ED at this time prior to discharge.      Melene Planan Liliana Dang, DO 04/11/16 [phone]

## 2016-04-11 NOTE — ED Notes (Signed)
Believes she may have slept on her shoulder/neck wrong a couple days ago and now the pain is getting worse. No injury to shoulder or neck.

## 2016-09-18 ENCOUNTER — Emergency Department (HOSPITAL_COMMUNITY)
Admission: EM | Admit: 2016-09-18 | Discharge: 2016-09-18 | Disposition: A | Discharge status: Home / Self Care | Payer: BLUE CROSS/BLUE SHIELD | Attending: Emergency Medicine | Admitting: Emergency Medicine

## 2016-09-18 ENCOUNTER — Encounter (HOSPITAL_COMMUNITY): Payer: Self-pay

## 2016-09-18 DIAGNOSIS — E869 Volume depletion, unspecified: Secondary | ICD-10-CM | POA: Insufficient documentation

## 2016-09-18 DIAGNOSIS — R112 Nausea with vomiting, unspecified: Secondary | ICD-10-CM | POA: Diagnosis not present

## 2016-09-18 DIAGNOSIS — G43809 Other migraine, not intractable, without status migrainosus: Secondary | ICD-10-CM

## 2016-09-18 DIAGNOSIS — R51 Headache: Secondary | ICD-10-CM | POA: Diagnosis present

## 2016-09-18 MED ORDER — PROCHLORPERAZINE EDISYLATE 5 MG/ML IJ SOLN
10.0000 mg | Freq: Four times a day (QID) | INTRAMUSCULAR | Status: DC | PRN
  Administered 2016-09-18: 10 mg via INTRAVENOUS
  Filled 2016-09-18: qty 2

## 2016-09-18 MED ORDER — KETOROLAC TROMETHAMINE 30 MG/ML IJ SOLN
30.0000 mg | Freq: Once | INTRAMUSCULAR | Status: AC
  Administered 2016-09-18: 30 mg via INTRAVENOUS
  Filled 2016-09-18: qty 1

## 2016-09-18 MED ORDER — ONDANSETRON HCL 4 MG/2ML IJ SOLN
4.0000 mg | Freq: Once | INTRAMUSCULAR | Status: AC
  Administered 2016-09-18: 4 mg via INTRAVENOUS
  Filled 2016-09-18: qty 2

## 2016-09-18 MED ORDER — MORPHINE SULFATE (PF) 4 MG/ML IV SOLN
4.0000 mg | INTRAVENOUS | Status: AC | PRN
  Administered 2016-09-18 (×2): 4 mg via INTRAVENOUS
  Filled 2016-09-18 (×2): qty 1

## 2016-09-18 MED ORDER — SODIUM CHLORIDE 0.9 % IV BOLUS (SEPSIS)
1000.0000 mL | Freq: Once | INTRAVENOUS | Status: AC
  Administered 2016-09-18: 1000 mL via INTRAVENOUS

## 2016-09-18 MED ORDER — ONDANSETRON 8 MG PO TBDP: 8.0000 mg | ORAL_TABLET | Freq: Three times a day (TID) | ORAL | 0 refills | Status: DC | PRN

## 2016-09-18 NOTE — ED Triage Notes (Signed)
Pt c/o headache and n/v x 1 week.  Pain score 10/10.  Pt reports taking ibuprofen w/o relief. + light sensitivity

## 2016-09-18 NOTE — ED Provider Notes (Signed)
[facility]-EMERGENCY DEPT Provider Note   CSN: [id number] Arrival date & time: 09/18/16  0810     History   Chief Complaint Chief Complaint  Patient presents with  . Headache  . Emesis    HPI [name] is a 35 y.o. female.  HPI Patient presents to the emergency department with complaints of headache over the past several days with associated photophobia and phonophobia.  She has a history of recurrent headaches.  She reports nausea and vomiting.  She denies abdominal pain.  She reports no fevers or chills.  No recent injury to her head.  Denies neck pain.  Denies cough or shortness of breath.  No urinary complaints.  She's tried over-the-counter medications without improvement in her symptoms.  Currently her headache is 10 out 10 and she is vomiting in the room.  No history of aneurysm.headache was not acute in onset   Past Medical History:  Diagnosis Date  . Bacterial infection   . Depression   . Irregular menses     Patient Active Problem List   Diagnosis Date Noted  . Encounter for trial of labor 12/10/2013  . NSVD (normal spontaneous vaginal delivery) 12/10/2013    Past Surgical History:  Procedure Laterality Date  . CESAREAN SECTION    . DILATION AND CURETTAGE OF UTERUS    . GALLBLADDER SURGERY      OB History    Gravida Para Term Preterm AB Living   4 3 3   1 3    SAB TAB Ectopic Multiple Live Births   1       3       Home Medications    Prior to Admission medications   Medication Sig Start Date End Date Taking? Authorizing Provider  b complex vitamins tablet Take 1 tablet by mouth daily.   Yes Historical Provider, MD  ibuprofen (ADVIL,MOTRIN) 800 MG tablet Take 1 tablet (800 mg total) by mouth 3 (three) times daily. 04/11/16  Yes Melene Planan Floyd, DO  Lorcaserin HCl 10 MG TABS Take 10 mg by mouth 2 (two) times daily. 08/14/16  Yes Historical Provider, MD  ondansetron (ZOFRAN ODT) 8 MG disintegrating tablet Take 1 tablet (8 mg total) by mouth every 8 (eight)  hours as needed for nausea or vomiting. 09/18/16   Azalia BilisKevin Freman Lapage, MD    Family History Family History  Problem Relation Age of Onset  . Hypertension Paternal Grandfather   . Diabetes Father     Social History Social History  Substance Use Topics  . Smoking status: Never Smoker  . Smokeless tobacco: Never Used  . Alcohol use No     Allergies   Patient has no known allergies.   Review of Systems Review of Systems  All other systems reviewed and are negative.    Physical Exam Updated Vital Signs BP (!) 101/51   Pulse 75   Temp 98.5 F (36.9 C) (Oral)   Resp 16   LMP 09/12/2016   SpO2 100%   Physical Exam  Constitutional: She is oriented to person, place, and time. She appears well-developed and well-nourished. No distress.  HENT:  Head: Normocephalic and atraumatic.  Eyes: EOM are normal.  Neck: Normal range of motion.  Cardiovascular: Normal rate, regular rhythm and normal heart sounds.   Pulmonary/Chest: Effort normal and breath sounds normal.  Abdominal: Soft. She exhibits no distension. There is no tenderness.  Musculoskeletal: Normal range of motion.  Neurological: She is alert and oriented to person, place, and time.  Skin:  Skin is warm and dry.  Psychiatric: She has a normal mood and affect. Judgment normal.  Nursing note and vitals reviewed.    ED Treatments / Results  Labs (all labs ordered are listed, but only abnormal results are displayed) Labs Reviewed - No data to display  EKG  EKG Interpretation None       Radiology No results found.  Procedures Procedures (including critical care time)  Medications Ordered in ED Medications  prochlorperazine (COMPAZINE) injection 10 mg (10 mg Intravenous Given 09/18/16 1054)  ketorolac (TORADOL) 30 MG/ML injection 30 mg (30 mg Intravenous Given 09/18/16 0957)  sodium chloride 0.9 % bolus 1,000 mL (0 mLs Intravenous Stopped 09/18/16 1056)  ondansetron (ZOFRAN) injection 4 mg (4 mg Intravenous Given  09/18/16 0956)  morphine 4 MG/ML injection 4 mg (4 mg Intravenous Given 09/18/16 1054)     Initial Impression / Assessment and Plan / ED Course  I have reviewed the triage vital signs and the nursing notes.  Pertinent labs & imaging results that were available during my care of the patient were reviewed by me and considered in my medical decision making (see chart for details).  Clinical Course     11:28 AM Patient feels much better at this time.  Suspect migraine.  Outpatient neurology follow-up.  Home with Zofran.  Pain resolved this time.  Doubt subarachnoid hemorrhage.   Final Clinical Impressions(s) / ED Diagnoses   Final diagnoses:  Other migraine without status migrainosus, not intractable  Nausea and vomiting, intractability of vomiting not specified, unspecified vomiting type  Volume depletion    New Prescriptions New Prescriptions   ONDANSETRON (ZOFRAN ODT) 8 MG DISINTEGRATING TABLET    Take 1 tablet (8 mg total) by mouth every 8 (eight) hours as needed for nausea or vomiting.     Azalia BilisKevin Rohil Lesch, MD 09/18/16 [phone]

## 2016-09-26 ENCOUNTER — Ambulatory Visit: Payer: Self-pay | Admitting: Neurology

## 2016-10-22 ENCOUNTER — Ambulatory Visit: Payer: Self-pay | Admitting: Neurology

## 2016-12-03 ENCOUNTER — Encounter: Payer: Self-pay | Admitting: Neurology

## 2016-12-03 ENCOUNTER — Ambulatory Visit (INDEPENDENT_AMBULATORY_CARE_PROVIDER_SITE_OTHER): Payer: BLUE CROSS/BLUE SHIELD | Admitting: Neurology

## 2016-12-03 VITALS — BP 115/82 | HR 93 | Ht 62.0 in | Wt 265.2 lb

## 2016-12-03 DIAGNOSIS — G4719 Other hypersomnia: Secondary | ICD-10-CM | POA: Diagnosis not present

## 2016-12-03 DIAGNOSIS — G43709 Chronic migraine without aura, not intractable, without status migrainosus: Secondary | ICD-10-CM | POA: Diagnosis not present

## 2016-12-03 DIAGNOSIS — J329 Chronic sinusitis, unspecified: Secondary | ICD-10-CM

## 2016-12-03 DIAGNOSIS — R51 Headache: Secondary | ICD-10-CM | POA: Diagnosis not present

## 2016-12-03 DIAGNOSIS — R0683 Snoring: Secondary | ICD-10-CM

## 2016-12-03 DIAGNOSIS — R519 Headache, unspecified: Secondary | ICD-10-CM

## 2016-12-03 MED ORDER — TOPIRAMATE 50 MG PO TABS: 100.0000 mg | ORAL_TABLET | Freq: Every day | ORAL | 6 refills | Status: DC

## 2016-12-03 MED ORDER — ZOLMITRIPTAN 5 MG PO TABS: 5.0000 mg | ORAL_TABLET | ORAL | 12 refills | Status: DC | PRN

## 2016-12-03 NOTE — Patient Instructions (Addendum)
Remember to drink plenty of fluid, eat healthy meals and do not skip any meals. Try to eat protein with a every meal and eat a healthy snack such as fruit or nuts in between meals. Try to keep a regular sleep-wake schedule and try to exercise daily, particularly in the form of walking, 20-30 minutes a day, if you can.   As far as your medications are concerned, I would like to suggest:   Topiramate: Start with 1/2 pill and bedtime and in 1 week can increase to a whole pill. In another week can increase to 2 pills at bedtime if no side effects. At onset of headache take Zomig with naproxen and Zofran. May repeat zomig in 2 hours. No more than 2x in one day.  ENT referral Sleep study evaluation   I would like to see you back in 3-4 months, sooner if we need to. Please call us with any interim questions, concerns, problems, updates or refill requests.   Our phone number is [phone]. We also have an after hours call service for urgent matters and there is a physician on-call for urgent questions. For any emergencies you know to call 911 or go to the nearest emergency room  To prevent or relieve headaches, try the following: Cool Compress. Lie down and place a cool compress on your head.  Avoid headache triggers. If certain foods or odors seem to have triggered your migraines in the past, avoid them. A headache diary might help you identify triggers.  Include physical activity in your daily routine. Try a daily walk or other moderate aerobic exercise.  Manage stress. Find healthy ways to cope with the stressors, such as delegating tasks on your to-do list.  Practice relaxation techniques. Try deep breathing, yoga, massage and visualization.  Eat regularly. Eating regularly scheduled meals and maintaining a healthy diet might help prevent headaches. Also, drink plenty of fluids.  Follow a regular sleep schedule. Sleep deprivation might contribute to headaches Consider biofeedback. With this  mind-body technique, you learn to control certain bodily functions - such as muscle tension, heart rate and blood pressure - to prevent headaches or reduce headache pain.    Proceed to emergency room if you experience new or worsening symptoms or symptoms do not resolve, if you have new neurologic symptoms or if headache is severe, or for any concerning symptom.   Topiramate tablets What is this medicine? TOPIRAMATE (toe PYRE a mate) is used to treat seizures in adults or children with epilepsy. It is also used for the prevention of migraine headaches. This medicine may be used for other purposes; ask your health care provider or pharmacist if you have questions. COMMON BRAND NAME(S): Topamax, Topiragen What should I tell my health care provider before I take this medicine? They need to know if you have any of these conditions: -bleeding disorders -cirrhosis of the liver or liver disease -diarrhea -glaucoma -kidney stones or kidney disease -low blood counts, like low white cell, platelet, or red cell counts -lung disease like asthma, obstructive pulmonary disease, emphysema -metabolic acidosis -on a ketogenic diet -schedule for surgery or a procedure -suicidal thoughts, plans, or attempt; a previous suicide attempt by you or a family member -an unusual or allergic reaction to topiramate, other medicines, foods, dyes, or preservatives -pregnant or trying to get pregnant -breast-feeding How should I use this medicine? Take this medicine by mouth with a glass of water. Follow the directions on the prescription label. Do not crush or chew. You may  take this medicine with meals. Take your medicine at regular intervals. Do not take it more often than directed. Talk to your pediatrician regarding the use of this medicine in children. Special care may be needed. While this drug may be prescribed for children as young as 49 years of age for selected conditions, precautions do apply. Overdosage: If  you think you have taken too much of this medicine contact a poison control center or emergency room at once. NOTE: This medicine is only for you. Do not share this medicine with others. What if I miss a dose? If you miss a dose, take it as soon as you can. If your next dose is to be taken in less than 6 hours, then do not take the missed dose. Take the next dose at your regular time. Do not take double or extra doses. What may interact with this medicine? Do not take this medicine with any of the following medications: -probenecid This medicine may also interact with the following medications: -acetazolamide -alcohol -amitriptyline -aspirin and aspirin-like medicines -birth control pills -certain medicines for depression -certain medicines for seizures -certain medicines that treat or prevent blood clots like warfarin, enoxaparin, dalteparin, apixaban, dabigatran, and rivaroxaban -digoxin -hydrochlorothiazide -lithium -medicines for pain, sleep, or muscle relaxation -metformin -methazolamide -NSAIDS, medicines for pain and inflammation, like ibuprofen or naproxen -pioglitazone -risperidone This list may not describe all possible interactions. Give your health care provider a list of all the medicines, herbs, non-prescription drugs, or dietary supplements you use. Also tell them if you smoke, drink alcohol, or use illegal drugs. Some items may interact with your medicine. What should I watch for while using this medicine? Visit your doctor or health care professional for regular checks on your progress. Do not stop taking this medicine suddenly. This increases the risk of seizures if you are using this medicine to control epilepsy. Wear a medical identification bracelet or chain to say you have epilepsy or seizures, and carry a card that lists all your medicines. This medicine can decrease sweating and increase your body temperature. Watch for signs of deceased sweating or fever,  especially in children. Avoid extreme heat, hot baths, and saunas. Be careful about exercising, especially in hot weather. Contact your health care provider right away if you notice a fever or decrease in sweating. You should drink plenty of fluids while taking this medicine. If you have had kidney stones in the past, this will help to reduce your chances of forming kidney stones. If you have stomach pain, with nausea or vomiting and yellowing of your eyes or skin, call your doctor immediately. You may get drowsy, dizzy, or have blurred vision. Do not drive, use machinery, or do anything that needs mental alertness until you know how this medicine affects you. To reduce dizziness, do not sit or stand up quickly, especially if you are an older patient. Alcohol can increase drowsiness and dizziness. Avoid alcoholic drinks. If you notice blurred vision, eye pain, or other eye problems, seek medical attention at once for an eye exam. The use of this medicine may increase the chance of suicidal thoughts or actions. Pay special attention to how you are responding while on this medicine. Any worsening of mood, or thoughts of suicide or dying should be reported to your health care professional right away. This medicine may increase the chance of developing metabolic acidosis. If left untreated, this can cause kidney stones, bone disease, or slowed growth in children. Symptoms include breathing fast, fatigue, loss  of appetite, irregular heartbeat, or loss of consciousness. Call your doctor immediately if you experience any of these side effects. Also, tell your doctor about any surgery you plan on having while taking this medicine since this may increase your risk for metabolic acidosis. Birth control pills may not work properly while you are taking this medicine. Talk to your doctor about using an extra method of birth control. Women who become pregnant while using this medicine may enroll in the [place]  Antiepileptic Drug Pregnancy Registry by calling [phone]. This registry collects information about the safety of antiepileptic drug use during pregnancy. What side effects may I notice from receiving this medicine? Side effects that you should report to your doctor or health care professional as soon as possible: -allergic reactions like skin rash, itching or hives, swelling of the face, lips, or tongue -decreased sweating and/or rise in body temperature -depression -difficulty breathing, fast or irregular breathing patterns -difficulty speaking -difficulty walking or controlling muscle movements -hearing impairment -redness, blistering, peeling or loosening of the skin, including inside the mouth -tingling, pain or numbness in the hands or feet -unusual bleeding or bruising -unusually weak or tired -worsening of mood, thoughts or actions of suicide or dying Side effects that usually do not require medical attention (report to your doctor or health care professional if they continue or are bothersome): -altered taste -back pain, joint or muscle aches and pains -diarrhea, or constipation -headache -loss of appetite -nausea -stomach upset, indigestion -tremors This list may not describe all possible side effects. Call your doctor for medical advice about side effects. You may report side effects to FDA at 1-800-FDA-1088. Where should I keep my medicine? Keep out of the reach of children. Store at room temperature between 15 and 30 degrees C (59 and 86 degrees F) in a tightly closed container. Protect from moisture. Throw away any unused medicine after the expiration date. NOTE: This sheet is a summary. It may not cover all possible information. If you have questions about this medicine, talk to your doctor, pharmacist, or health care provider.  2017 Elsevier/Gold Standard (2013-11-01 23:17:57)    Zolmitriptan tablets What is this medicine? ZOLMITRIPTAN (zohl mi TRIP tan) is  used to treat migraines with or without aura. An aura is a strange feeling or visual disturbance that warns you of an attack. It is not used to prevent migraines. This medicine may be used for other purposes; ask your health care provider or pharmacist if you have questions. COMMON BRAND NAME(S): Zomig What should I tell my health care provider before I take this medicine? They need to know if you have any of these conditions: -bowel disease, colitis or bloody diarrhea -diabetes -family history of heart disease -fast or irregular heartbeat -headaches that are different from your usual migraine -heart or blood vessel disease, angina (chest pain), or previous heart attack -high blood pressure -high cholesterol -history of stroke, transient ischemic attacks (TIAs or mini-strokes), or intracranial bleeding -kidney or liver disease -overweight -postmenopausal or surgical removal of uterus and ovaries -seizure disorder -tobacco smoker -an unusual or allergic reaction to zolmitriptan, other medicines, foods, dyes, or preservatives -pregnant or trying to get pregnant -breast-feeding How should I use this medicine? Take this medicine by mouth with a glass of water. Follow the directions on the prescription label. This medicine is taken at the first symptoms of a migraine. It is not for everyday use. If your migraine headache returns after one dose, you can take another dose as directed.  You must allow at least 2 hours between doses, and do not take more than 10 mg total in any 24 hour period. If there is no improvement at all after the first dose, do not take a second dose without talking to your doctor or health care professional. Do not take your medicine more often than directed. Talk to your pediatrician regarding the use of this medicine in children. Special care may be needed. Overdosage: If you think you have taken too much of this medicine contact a poison control center or emergency room at  once. NOTE: This medicine is only for you. Do not share this medicine with others. What if I miss a dose? This does not apply; this medicine is not for regular use. What may interact with this medicine? Do not take this medicine with any of the following medicines: -amphetamine or cocaine -dihydroergotamine, ergotamine, ergoloid mesylates, methysergide, or ergot-type medication - do not take within 24 hours of taking zolmitriptan -feverfew -MAOIs like Carbex, Eldepryl, Marplan, Nardil, and Parnate - do not take zolmitriptan within 2 weeks of stopping MAOI therapy -other migraine medicines like almotriptan, eletriptan, naratriptan, rizatriptan, sumatriptan - do not take within 24 hours of taking zolmitriptan -tryptophan This medicine may also interact with the following medications: -cimetidine -birth control pills -medicines for mental depression, anxiety or mood problems -propranolol This list may not describe all possible interactions. Give your health care provider a list of all the medicines, herbs, non-prescription drugs, or dietary supplements you use. Also tell them if you smoke, drink alcohol, or use illegal drugs. Some items may interact with your medicine. What should I watch for while using this medicine? Only take this medicine for a migraine headache. Take it if you get warning symptoms or at the start of a migraine attack. It is not for regular use to prevent migraine attacks. You may get drowsy or dizzy. Do not drive, use machinery, or do anything that needs mental alertness until you know how this medicine affects you. To reduce dizzy or fainting spells, do not sit or stand up quickly, especially if you are an older patient. Alcohol can increase drowsiness, dizziness and flushing. Avoid alcoholic drinks. Smoking cigarettes may increase the risk of heart-related side effects from using this medicine. If you take migraine medicines for 10 or more days a month, your migraines may  get worse. Keep a diary of headache days and medicine use. Contact your healthcare professional if your migraine attacks occur more frequently. What side effects may I notice from receiving this medicine? Side effects that you should report to your doctor or health care professional as soon as possible: -allergic reactions like skin rash, itching or hives, swelling of the face, lips, or tongue -fast, slow, or irregular heart beat -increased blood pressure -palpitations -severe stomach pain and cramping, bloody diarrhea -signs and symptoms of a blood clot such as breathing problems; changes in vision; chest pain; severe, sudden headache; pain, swelling, warmth in the leg; trouble speaking; sudden numbness or weakness of the face, arm or leg -tingling, pain, or numbness in the face, hands, or feet Side effects that usually do not require medical attention (report to your doctor or health care professional if they continue or are bothersome): -dry mouth -feeling warm, flushing, or redness of the face -headache -muscle cramps, pain -nausea, vomiting -unusually tired or weak This list may not describe all possible side effects. Call your doctor for medical advice about side effects. You may report side effects to FDA  at 1-800-FDA-1088. Where should I keep my medicine? Keep out of the reach of children. Store at room temperature between 20 and 25 degrees C (68 and 77 degrees F). Protect from light and moisture. Throw away any unused medicine after the expiration date. NOTE: This sheet is a summary. It may not cover all possible information. If you have questions about this medicine, talk to your doctor, pharmacist, or health care provider.  2017 Elsevier/Gold Standard (2013-06-29 10:20:18)

## 2016-12-03 NOTE — Progress Notes (Signed)
[facility]    Provider:  Dr Kaleeya Hancock Referring Provider: Emergency room  CC:  Migraines  HPI:  Julie CassLucia GaskinsetteSamia H [name] is a 36 y.o. female here as a referral from the emergency room for headaches. She has had headaches since college. Worsening in the last several years slowly and progressively worsening. Daughter is here and provides much information on mother's snoring which is loud and heard from other rooms in the house. She has headaches more than half the month and more than 8 days a month are migrainous and severe. Headaches are mostly in the back of the head and in the neck, pulsating, throbbing, worse with movement, sensitivity to light and sounds,nausea and vomiting. No aura. She gets vision changes with the headaches. They can last the whole day until she goes to sleep. Going into a dark room, laying down and sleeping helps. No FHx of migraines. Often she wakes up with a headache. Twice a week wakes up with a headache or it can start in the afternoon. Headaches can wake her up. She snores. She snores very loud, daughter is here and reports she can hear her from the other room. She is very tired with severe fatigue during the day. She has chronic sinus pain and severe mucous. She has tried imitrex but whenever she takes it it she gets severe palpitations. No other focal neurologic deficits, associated symptoms, inciting events or modifiable factors.   Reviewed notes, labs and imaging from outside physicians, which showed:  BUN 4, creatinine is 0.54 05/31/2013  Reviewed EPIC nota and emergency room visit: Patient presented to the emergency department with complaints of headaches over the last several days on 09/18/2016. Symptoms appeared migrainous with associated photophobia and phonophobia. She does have a history of recurrent headaches. She also had nausea and vomiting. No inciting event or injury to her head. Headache was 10 out of 10 and vomiting in the room. [place] [facility].She was given IV Toradol, morphine, zofran, and compazine. Symptoms resolved. Suspected migraine. Rx zofran provided.She reported headaches in the past treated with  OTC medicines-- ibuprofen 800 mg every 6-8 hours. Tylenol every 4 hours. She also c/o sensitivity to sound, light, nausea and blurred vision. She was given imitrex and zofran and naproxen.    Review of Systems: Patient complains of symptoms per HPI as well as the following symptoms: Weight gain, fatigue, headache, decreased energy. Pertinent negatives per HPI. All others negative.   Social History   Social History  . Marital status: Married    Spouse name: Arbutus PedMohamed  . Number of children: 3  . Years of education: BA   Occupational History  . [employer]    Social History Main Topics  . Smoking status: Never Smoker  . Smokeless tobacco: Never Used  . Alcohol use No  . Drug use: No  . Sexual activity: Yes   Other Topics Concern  . Not on file   Social History Narrative   Lives with husband and children   Caffeine use: 1-2 cups per day    Family History  Problem Relation Age of Onset  . Hypertension Paternal Grandfather   . Diabetes Father   . Headache Neg Hx     Past Medical History:  Diagnosis Date  . Bacterial infection   . Depression   . Headache   . Irregular menses     Past Surgical History:  Procedure Laterality Date  . CESAREAN SECTION  2007  .  DILATION AND CURETTAGE OF UTERUS    . GALLBLADDER SURGERY      Current Outpatient Prescriptions  Medication Sig Dispense Refill  . Fexofenadine HCl (MUCINEX ALLERGY PO) Take 1 tablet by mouth as needed.    . naproxen (NAPROSYN) 500 MG tablet Take 500 mg by mouth 2 (two) times daily.    Marland Kitchen b complex vitamins tablet Take 1 tablet by mouth daily.    . Lorcaserin HCl 10 MG TABS Take 10 mg by mouth 2 (two) times daily.    . ondansetron (ZOFRAN ODT) 8 MG disintegrating tablet Take 1 tablet (8  mg total) by mouth every 8 (eight) hours as needed for nausea or vomiting. 10 tablet 0  . topiramate (TOPAMAX) 50 MG tablet Take 2 tablets (100 mg total) by mouth daily. 60 tablet 6  . zolmitriptan (ZOMIG) 5 MG tablet Take 1 tablet (5 mg total) by mouth as needed for migraine. 10 tablet 12   No current facility-administered medications for this visit.     Allergies as of 12/03/2016  . (No Known Allergies)    Vitals: BP 115/82 (BP Location: Right Arm, Patient Position: Sitting, Cuff Size: Normal)   Pulse 93   Ht 5\' 2"  (1.575 m)   Wt 265 lb 3.2 oz (120.3 kg)   BMI 48.51 kg/m  Last Weight:  Wt Readings from Last 1 Encounters:  12/03/16 265 lb 3.2 oz (120.3 kg)   Last Height:   Ht Readings from Last 1 Encounters:  12/03/16 5\' 2"  (1.575 m)    Physical exam: Exam: Gen: NAD, conversant, well nourised, obese, well groomed                     CV: RRR, no MRG. No Carotid Bruits. No peripheral edema, warm, nontender Eyes: Conjunctivae clear without exudates or hemorrhage  Neuro: Detailed Neurologic Exam  Speech:    Speech is normal; fluent and spontaneous with normal comprehension.  Cognition:    The patient is oriented to person, place, and time;     recent and remote memory intact;     language fluent;     normal attention, concentration,     fund of knowledge Cranial Nerves:    The pupils are equal, round, and reactive to light. The fundi are normal and spontaneous venous pulsations are present. Visual fields are full to finger confrontation. Extraocular movements are intact. Trigeminal sensation is intact and the muscles of mastication are normal. The face is symmetric. The palate elevates in the midline. Hearing intact. Voice is normal. Shoulder shrug is normal. The tongue has normal motion without fasciculations.   Coordination:    Normal finger to nose and heel to shin. Normal rapid alternating movements.   Gait:    Heel-toe and tandem gait are normal.   Motor  Observation:    No asymmetry, no atrophy, and no involuntary movements noted. Tone:    Normal muscle tone.    Posture:    Posture is normal. normal erect    Strength:    Strength is V/V in the upper and lower limbs.      Sensation: intact to LT     Reflex Exam:  DTR's:    Deep tendon reflexes in the upper and lower extremities are normal bilaterally.   Toes:    The toes are downgoing bilaterally.   Clonus:    Clonus is absent.      Assessment/Plan:  36 year old with chronic migraine headaches without aura not intractable  -  Sleep eval: Often she wakes up with a headache. Twice a week or more wakes up with a headache and. Headaches can wake her up out of sleep. She snores. She snores very loud, daughter can hear her from the other room. Patient is very tired with severe fatigue during the day, obesity BMI 48 - Cough, mucous, chronic sinus congestion: ENT referral - Recommended the healthy weight and wellness center at [facility] with Dr. Dalbert Garnet for weight management (BMI 48)  As far as your medications are concerned, I would like to suggest:   Topiramate: Start with 1/2 pill and bedtime and in 1 week can increase to a whole pill. In another week can increase to 2 pills at bedtime if no side effects. At onset of headache take Zomig with naproxen and Zofran. May repeat zomig in 2 hours. No more than 2x in one day.  ENT referral Sleep study evaluation  Labs today  Our phone number is [phone]. We also have an after hours call service for urgent matters and there is a physician on-call for urgent questions. For any emergencies you know to call 911 or go to the nearest emergency room  To prevent or relieve headaches, try the following: Cool Compress. Lie down and place a cool compress on your head.  Avoid headache triggers. If certain foods or odors seem to have triggered your migraines in the past, avoid them. A headache diary might help you identify triggers.  Include physical  activity in your daily routine. Try a daily walk or other moderate aerobic exercise.  Manage stress. Find healthy ways to cope with the stressors, such as delegating tasks on your to-do list.  Practice relaxation techniques. Try deep breathing, yoga, massage and visualization.  Eat regularly. Eating regularly scheduled meals and maintaining a healthy diet might help prevent headaches. Also, drink plenty of fluids.  Follow a regular sleep schedule. Sleep deprivation might contribute to headaches Consider biofeedback. With this mind-body technique, you learn to control certain bodily functions - such as muscle tension, heart rate and blood pressure - to prevent headaches or reduce headache pain.   F/u 3-4 months  Naomie Dean, MD  [facility] [address] [place], [place] [address]  Phone [phone] Fax [phone]

## 2016-12-05 ENCOUNTER — Encounter: Payer: Self-pay | Admitting: Neurology

## 2016-12-05 DIAGNOSIS — G43709 Chronic migraine without aura, not intractable, without status migrainosus: Secondary | ICD-10-CM | POA: Insufficient documentation

## 2016-12-20 ENCOUNTER — Ambulatory Visit
Admission: RE | Admit: 2016-12-20 | Discharge: 2016-12-20 | Disposition: A | Discharge status: Home / Self Care | Payer: BLUE CROSS/BLUE SHIELD | Source: Ambulatory Visit | Attending: Otolaryngology | Admitting: Otolaryngology

## 2016-12-20 ENCOUNTER — Other Ambulatory Visit: Payer: Self-pay | Admitting: Otolaryngology

## 2016-12-20 DIAGNOSIS — J329 Chronic sinusitis, unspecified: Secondary | ICD-10-CM

## 2016-12-25 ENCOUNTER — Institutional Professional Consult (permissible substitution): Payer: BLUE CROSS/BLUE SHIELD | Admitting: Neurology

## 2017-03-19 ENCOUNTER — Ambulatory Visit: Payer: BLUE CROSS/BLUE SHIELD | Admitting: Neurology

## 2018-07-22 ENCOUNTER — Other Ambulatory Visit: Payer: Self-pay

## 2018-07-22 ENCOUNTER — Encounter: Payer: Self-pay | Admitting: Obstetrics and Gynecology

## 2018-07-22 ENCOUNTER — Other Ambulatory Visit: Payer: Self-pay | Admitting: Obstetrics & Gynecology

## 2018-07-22 DIAGNOSIS — N90810 Female genital mutilation status, unspecified: Secondary | ICD-10-CM | POA: Diagnosis present

## 2018-07-22 DIAGNOSIS — Z6841 Body Mass Index (BMI) 40.0 and over, adult: Secondary | ICD-10-CM

## 2018-07-22 DIAGNOSIS — E039 Hypothyroidism, unspecified: Secondary | ICD-10-CM | POA: Diagnosis not present

## 2018-07-22 DIAGNOSIS — Z87440 Personal history of urinary (tract) infections: Secondary | ICD-10-CM

## 2018-07-22 DIAGNOSIS — O09529 Supervision of elderly multigravida, unspecified trimester: Secondary | ICD-10-CM

## 2018-07-22 DIAGNOSIS — Z8659 Personal history of other mental and behavioral disorders: Secondary | ICD-10-CM

## 2018-07-22 DIAGNOSIS — Z98891 History of uterine scar from previous surgery: Secondary | ICD-10-CM

## 2018-07-22 DIAGNOSIS — Z8759 Personal history of other complications of pregnancy, childbirth and the puerperium: Secondary | ICD-10-CM

## 2018-07-22 NOTE — H&P (Signed)
[name] is a 37 y.o. female, [id number] presenting on 07/23/18 for scheduled D&E due to missed miscarriage dx on 07/21/18.  Seen for NOB w/u on that date, with prior US on 07/06/18 showing SIUP, viable, measuring 9 5/7 weeks.  She noted brown spotting this past weekend, denied any significant pain.  Seen 9/10 for NOB w/u at 11 5/7 weeks by dates, with no FHR able to be auscultated.  US showed IUFD measuring 9 4/7 weeks.  Options for management were reviewed, including observation, medical management with [employer], or D&E.  Patient elected to proceed with scheduling the D&E.  She has had previous SAB in 2006 at 13 weeks, with D&C at that time.  Patient Active Problem List   Diagnosis Date Noted  . Previous cesarean section--first pregnancy, breech/oligo 07/22/2018  . History of VBAC--x 2 07/22/2018  . Hypothyroidism in past, normal TSH at present 07/22/2018  . History of pyelonephritis during pregnancy--after last delivery 07/22/2018  . AMA (advanced maternal age) multigravida 35+ 07/22/2018  . BMI 45.0-49.9, adult (HCC) 07/22/2018  . History of depression 07/22/2018  . 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 Chronic migraine w/o aura w/o status migrainosus, not intractable 12/05/2016     OB History  Gravida Para Term Preterm AB Living  5 3 3   2 3   SAB TAB Ectopic Multiple Live Births  2       3    # Outcome Date GA Lbr Len/2nd Weight Sex Delivery Anes PTL Lv  5 SAB 07/21/18          4 Term 12/10/13 [redacted]w[redacted]d 07:48 / 00:31 3000 g F VBAC EPI  LIV  3 Term 01/14/08 [redacted]w[redacted]d   M VBAC   LIV  2 Term 04/11/06 [redacted]w[redacted]d   F CS-LTranv   LIV     Birth Comments: breech  1 SAB 05/2005 [redacted]w[redacted]d          Past Medical History:  Diagnosis Date  . Bacterial infection   . Depression   . Headache   . Irregular menses    Past Surgical History:  Procedure Laterality Date  . CESAREAN SECTION  2007  . DILATION AND CURETTAGE OF UTERUS    . GALLBLADDER SURGERY     Family History: family history includes  Diabetes in her father; Hypertension in her paternal grandfather.   Social History:  reports that she has never smoked. She has never used smokeless tobacco. She reports that she does not drink alcohol or use drugs.  Patient is from [place], married, college educated, employed as a Pharmacologist, of the [employer].   ROS:  Brown spotting, denies any other significant sx.  No Known Allergies   Chest clear Heart RRR without murmur Abd gravid, NT, FH approx 9 week size, moderate pannus noted Pelvic: Small amount brown d/c in vault, cervix closed, NT Ext: WNL    Prenatal labs: ABO, Rh:  O+ Antibody:  Neg Rubella:   Immune RPR:  NR  HBsAg:   Neg HIV:   NR Sickle cell/Hgb electrophoresis:  AA, but ? Fe deficiency Pap:  WNL 04/2018 GC:  Negative 07/13/18 Chlamydia:  Negative 07/13/18 Other:   Hgb 11.5 on 07/06/18 Fe studies are pending from 07/21/18 office visit.    Assessment/Plan: Missed AB at 9 4/7 weeks by size, 11 weeks by dates O+ blood type Patient requests D&E  Plan: Admit to [facility] per consult with Dr. Mora Appl Routine [facility] pre-op orders Support to patient for loss. Already has note to be  OOW until 07/30/18.  Nigel Bridgeman CNM, [place] 07/22/2018, 9:14 AM

## 2018-07-23 ENCOUNTER — Ambulatory Visit (HOSPITAL_COMMUNITY): Payer: BLUE CROSS/BLUE SHIELD | Admitting: Anesthesiology

## 2018-07-23 ENCOUNTER — Other Ambulatory Visit: Payer: Self-pay

## 2018-07-23 ENCOUNTER — Encounter (HOSPITAL_COMMUNITY)
Admission: RE | Disposition: A | Discharge status: Home / Self Care | Payer: Self-pay | Source: Ambulatory Visit | Attending: Obstetrics & Gynecology

## 2018-07-23 ENCOUNTER — Ambulatory Visit (HOSPITAL_COMMUNITY)
Admission: RE | Admit: 2018-07-23 | Discharge: 2018-07-23 | Disposition: A | Discharge status: Home / Self Care | Payer: BLUE CROSS/BLUE SHIELD | Source: Ambulatory Visit | Attending: Obstetrics & Gynecology | Admitting: Obstetrics & Gynecology

## 2018-07-23 ENCOUNTER — Encounter (HOSPITAL_COMMUNITY): Payer: Self-pay

## 2018-07-23 DIAGNOSIS — Z6841 Body Mass Index (BMI) 40.0 and over, adult: Secondary | ICD-10-CM | POA: Insufficient documentation

## 2018-07-23 DIAGNOSIS — O09529 Supervision of elderly multigravida, unspecified trimester: Secondary | ICD-10-CM

## 2018-07-23 DIAGNOSIS — Z8759 Personal history of other complications of pregnancy, childbirth and the puerperium: Secondary | ICD-10-CM

## 2018-07-23 DIAGNOSIS — O021 Missed abortion: Secondary | ICD-10-CM | POA: Insufficient documentation

## 2018-07-23 DIAGNOSIS — Z87440 Personal history of urinary (tract) infections: Secondary | ICD-10-CM

## 2018-07-23 DIAGNOSIS — Z8659 Personal history of other mental and behavioral disorders: Secondary | ICD-10-CM

## 2018-07-23 DIAGNOSIS — E039 Hypothyroidism, unspecified: Secondary | ICD-10-CM | POA: Diagnosis not present

## 2018-07-23 DIAGNOSIS — N90810 Female genital mutilation status, unspecified: Secondary | ICD-10-CM | POA: Diagnosis present

## 2018-07-23 DIAGNOSIS — Z98891 History of uterine scar from previous surgery: Secondary | ICD-10-CM

## 2018-07-23 HISTORY — PX: DILATION AND EVACUATION: SHX1459

## 2018-07-23 HISTORY — DX: Other seasonal allergic rhinitis: J30.2

## 2018-07-23 HISTORY — DX: Gastro-esophageal reflux disease without esophagitis: K21.9

## 2018-07-23 LAB — CBC
HCT: 38.8 % (ref 36.0–46.0)
HEMOGLOBIN: 12.6 g/dL (ref 12.0–15.0)
MCH: 26 pg (ref 26.0–34.0)
MCHC: 32.5 g/dL (ref 30.0–36.0)
MCV: 80.2 fL (ref 78.0–100.0)
PLATELETS: 306 10*3/uL (ref 150–400)
RBC: 4.84 MIL/uL (ref 3.87–5.11)
RDW: 15 % (ref 11.5–15.5)
WBC: 10 10*3/uL (ref 4.0–10.5)

## 2018-07-23 SURGERY — DILATION AND EVACUATION, UTERUS
Anesthesia: General

## 2018-07-23 MED ORDER — SCOPOLAMINE 1 MG/3DAYS TD PT72
1.0000 | MEDICATED_PATCH | Freq: Once | TRANSDERMAL | Status: DC
  Administered 2018-07-23: 1.5 mg via TRANSDERMAL

## 2018-07-23 MED ORDER — LIDOCAINE HCL (CARDIAC) PF 100 MG/5ML IV SOSY
PREFILLED_SYRINGE | INTRAVENOUS | Status: DC | PRN
  Administered 2018-07-23: 80 mg via INTRAVENOUS

## 2018-07-23 MED ORDER — HYDROMORPHONE HCL 1 MG/ML IJ SOLN: 0.2000 mg | INTRAMUSCULAR | Status: DC | PRN

## 2018-07-23 MED ORDER — LIDOCAINE HCL (CARDIAC) PF 100 MG/5ML IV SOSY
PREFILLED_SYRINGE | INTRAVENOUS | Status: AC
  Filled 2018-07-23: qty 5

## 2018-07-23 MED ORDER — KETOROLAC TROMETHAMINE 30 MG/ML IJ SOLN
INTRAMUSCULAR | Status: DC | PRN
  Administered 2018-07-23: 30 mg via INTRAVENOUS

## 2018-07-23 MED ORDER — KETOROLAC TROMETHAMINE 30 MG/ML IJ SOLN: 30.0000 mg | Freq: Once | INTRAMUSCULAR | Status: DC | PRN

## 2018-07-23 MED ORDER — MIDAZOLAM HCL 2 MG/2ML IJ SOLN
INTRAMUSCULAR | Status: DC | PRN
  Administered 2018-07-23 (×2): 1 mg via INTRAVENOUS

## 2018-07-23 MED ORDER — ONDANSETRON HCL 4 MG PO TABS: 4.0000 mg | ORAL_TABLET | Freq: Four times a day (QID) | ORAL | Status: DC | PRN

## 2018-07-23 MED ORDER — PROPOFOL 10 MG/ML IV BOLUS
INTRAVENOUS | Status: DC | PRN
  Administered 2018-07-23: 50 mg via INTRAVENOUS
  Administered 2018-07-23: 20 mg via INTRAVENOUS
  Administered 2018-07-23: 180 mg via INTRAVENOUS

## 2018-07-23 MED ORDER — OXYCODONE HCL 5 MG PO TABS
ORAL_TABLET | ORAL | Status: AC
  Filled 2018-07-23: qty 1

## 2018-07-23 MED ORDER — DEXAMETHASONE SODIUM PHOSPHATE 10 MG/ML IJ SOLN
INTRAMUSCULAR | Status: DC | PRN
  Administered 2018-07-23: 10 mg via INTRAVENOUS

## 2018-07-23 MED ORDER — FENTANYL CITRATE (PF) 100 MCG/2ML IJ SOLN
INTRAMUSCULAR | Status: AC
  Administered 2018-07-23: 50 ug via INTRAVENOUS
  Filled 2018-07-23: qty 2

## 2018-07-23 MED ORDER — LIDOCAINE HCL 1 % IJ SOLN
INTRAMUSCULAR | Status: DC | PRN
  Administered 2018-07-23: 10 mL

## 2018-07-23 MED ORDER — ACETAMINOPHEN 160 MG/5ML PO SOLN: 325.0000 mg | ORAL | Status: DC | PRN

## 2018-07-23 MED ORDER — PROPOFOL 10 MG/ML IV BOLUS
INTRAVENOUS | Status: AC
  Filled 2018-07-23: qty 20

## 2018-07-23 MED ORDER — OXYTOCIN 10 UNIT/ML IJ SOLN
INTRAMUSCULAR | Status: DC | PRN
  Administered 2018-07-23: 20 [IU] via INTRAMUSCULAR

## 2018-07-23 MED ORDER — SIMETHICONE 80 MG PO CHEW: 80.0000 mg | CHEWABLE_TABLET | Freq: Four times a day (QID) | ORAL | Status: DC | PRN

## 2018-07-23 MED ORDER — FENTANYL CITRATE (PF) 250 MCG/5ML IJ SOLN
INTRAMUSCULAR | Status: AC
  Filled 2018-07-23: qty 5

## 2018-07-23 MED ORDER — IBUPROFEN 800 MG PO TABS: 800.0000 mg | ORAL_TABLET | Freq: Three times a day (TID) | ORAL | 0 refills | Status: DC | PRN

## 2018-07-23 MED ORDER — GLYCOPYRROLATE 0.2 MG/ML IJ SOLN
INTRAMUSCULAR | Status: AC
  Filled 2018-07-23: qty 1

## 2018-07-23 MED ORDER — ONDANSETRON HCL 4 MG/2ML IJ SOLN: 4.0000 mg | Freq: Once | INTRAMUSCULAR | Status: DC | PRN

## 2018-07-23 MED ORDER — LACTATED RINGERS IV SOLN: INTRAVENOUS | Status: DC

## 2018-07-23 MED ORDER — FENTANYL CITRATE (PF) 100 MCG/2ML IJ SOLN
INTRAMUSCULAR | Status: DC | PRN
  Administered 2018-07-23 (×3): 50 ug via INTRAVENOUS

## 2018-07-23 MED ORDER — ONDANSETRON HCL 4 MG/2ML IJ SOLN
INTRAMUSCULAR | Status: AC
  Filled 2018-07-23: qty 2

## 2018-07-23 MED ORDER — METHYLERGONOVINE MALEATE 0.2 MG PO TABS: 0.2000 mg | ORAL_TABLET | Freq: Four times a day (QID) | ORAL | 0 refills | Status: AC

## 2018-07-23 MED ORDER — 0.9 % SODIUM CHLORIDE (POUR BTL) OPTIME
TOPICAL | Status: DC | PRN
  Administered 2018-07-23: 1000 mL

## 2018-07-23 MED ORDER — GLYCOPYRROLATE 0.2 MG/ML IJ SOLN
INTRAMUSCULAR | Status: DC | PRN
  Administered 2018-07-23: 0.1 mg via INTRAVENOUS

## 2018-07-23 MED ORDER — MEPERIDINE HCL 25 MG/ML IJ SOLN: 6.2500 mg | INTRAMUSCULAR | Status: DC | PRN

## 2018-07-23 MED ORDER — MIDAZOLAM HCL 2 MG/2ML IJ SOLN
INTRAMUSCULAR | Status: AC
  Filled 2018-07-23: qty 2

## 2018-07-23 MED ORDER — OXYCODONE HCL 5 MG PO TABS
5.0000 mg | ORAL_TABLET | Freq: Once | ORAL | Status: AC | PRN
  Administered 2018-07-23: 5 mg via ORAL

## 2018-07-23 MED ORDER — ONDANSETRON HCL 4 MG/2ML IJ SOLN: 4.0000 mg | Freq: Four times a day (QID) | INTRAMUSCULAR | Status: DC | PRN

## 2018-07-23 MED ORDER — MENTHOL 3 MG MT LOZG: 1.0000 | LOZENGE | OROMUCOSAL | Status: DC | PRN

## 2018-07-23 MED ORDER — LIDOCAINE HCL 1 % IJ SOLN
INTRAMUSCULAR | Status: AC
  Filled 2018-07-23: qty 20

## 2018-07-23 MED ORDER — OXYTOCIN 10 UNIT/ML IJ SOLN
INTRAMUSCULAR | Status: AC
  Filled 2018-07-23: qty 2

## 2018-07-23 MED ORDER — OXYCODONE-ACETAMINOPHEN 5-325 MG PO TABS: 1.0000 | ORAL_TABLET | ORAL | 0 refills | Status: DC | PRN

## 2018-07-23 MED ORDER — ONDANSETRON HCL 4 MG/2ML IJ SOLN
INTRAMUSCULAR | Status: DC | PRN
  Administered 2018-07-23: 4 mg via INTRAVENOUS

## 2018-07-23 MED ORDER — DOXYCYCLINE HYCLATE 100 MG PO CAPS: 100.0000 mg | ORAL_CAPSULE | Freq: Two times a day (BID) | ORAL | 0 refills | Status: AC

## 2018-07-23 MED ORDER — OXYCODONE-ACETAMINOPHEN 5-325 MG PO TABS: 2.0000 | ORAL_TABLET | ORAL | Status: DC | PRN

## 2018-07-23 MED ORDER — LACTATED RINGERS IV SOLN
INTRAVENOUS | Status: DC
  Administered 2018-07-23: 11:00:00 via INTRAVENOUS
  Administered 2018-07-23: 125 mL/h via INTRAVENOUS

## 2018-07-23 MED ORDER — FENTANYL CITRATE (PF) 100 MCG/2ML IJ SOLN
25.0000 ug | INTRAMUSCULAR | Status: DC | PRN
  Administered 2018-07-23: 50 ug via INTRAVENOUS

## 2018-07-23 MED ORDER — OXYCODONE HCL 5 MG/5ML PO SOLN: 5.0000 mg | Freq: Once | ORAL | Status: AC | PRN

## 2018-07-23 MED ORDER — KETOROLAC TROMETHAMINE 30 MG/ML IJ SOLN: 30.0000 mg | Freq: Once | INTRAMUSCULAR | Status: DC

## 2018-07-23 MED ORDER — HYDROCODONE-ACETAMINOPHEN 5-325 MG PO TABS: 1.0000 | ORAL_TABLET | Freq: Four times a day (QID) | ORAL | 0 refills | Status: DC | PRN

## 2018-07-23 MED ORDER — ACETAMINOPHEN 325 MG PO TABS: 325.0000 mg | ORAL_TABLET | ORAL | Status: DC | PRN

## 2018-07-23 MED ORDER — SCOPOLAMINE 1 MG/3DAYS TD PT72
MEDICATED_PATCH | TRANSDERMAL | Status: AC
  Administered 2018-07-23: 1.5 mg via TRANSDERMAL
  Filled 2018-07-23: qty 1

## 2018-07-23 SURGICAL SUPPLY — 19 items
CATH ROBINSON RED A/P 16FR (CATHETERS) ×3 IMPLANT
DECANTER SPIKE VIAL GLASS SM (MISCELLANEOUS) ×3 IMPLANT
DILATOR CANAL MILEX (MISCELLANEOUS) IMPLANT
GLOVE BIO SURGEON STRL SZ 6.5 (GLOVE) ×2 IMPLANT
GLOVE BIO SURGEONS STRL SZ 6.5 (GLOVE) ×1
GLOVE BIOGEL PI IND STRL 7.0 (GLOVE) ×2 IMPLANT
GLOVE BIOGEL PI INDICATOR 7.0 (GLOVE) ×4
GOWN STRL REUS W/TWL LRG LVL3 (GOWN DISPOSABLE) ×6 IMPLANT
KIT BERKELEY 1ST TRIMESTER 3/8 (MISCELLANEOUS) ×3 IMPLANT
NS IRRIG 1000ML POUR BTL (IV SOLUTION) ×3 IMPLANT
PACK VAGINAL MINOR WOMEN LF (CUSTOM PROCEDURE TRAY) ×3 IMPLANT
PAD OB MATERNITY 4.3X12.25 (PERSONAL CARE ITEMS) ×3 IMPLANT
PAD PREP 24X48 CUFFED NSTRL (MISCELLANEOUS) ×3 IMPLANT
SET BERKELEY SUCTION TUBING (SUCTIONS) ×3 IMPLANT
TOWEL OR 17X24 6PK STRL BLUE (TOWEL DISPOSABLE) ×6 IMPLANT
VACURETTE 10 RIGID CVD (CANNULA) ×2 IMPLANT
VACURETTE 7MM CVD STRL WRAP (CANNULA) IMPLANT
VACURETTE 8 RIGID CVD (CANNULA) IMPLANT
VACURETTE 9 RIGID CVD (CANNULA) IMPLANT

## 2018-07-23 NOTE — Anesthesia Postprocedure Evaluation (Signed)
Anesthesia Post Note  Patient: [name]  Procedure(s) Performed: SUCTION DILATATION AND EVACUATION (N/A )     Patient location during evaluation: PACU Anesthesia Type: General Level of consciousness: awake Pain management: pain level controlled Vital Signs Assessment: post-procedure vital signs reviewed and stable Respiratory status: spontaneous breathing Cardiovascular status: stable Postop Assessment: no apparent nausea or vomiting Anesthetic complications: no    Last Vitals:  Vitals:   07/23/18 1230 07/23/18 1240  BP: 132/76 123/67  Pulse: 90 93  Resp: 16 19  Temp:    SpO2: 96% 96%    Last Pain:  Vitals:   07/23/18 1240  TempSrc:   PainSc: 0-No pain   Pain Goal: Patients Stated Pain Goal: 4 (07/23/18 0905)               Nikolas Casher JR,JOHN Susann GivensFRANKLIN

## 2018-07-23 NOTE — Transfer of Care (Signed)
Immediate Anesthesia Transfer of Care Note  Patient: [name]  Procedure(s) Performed: SUCTION DILATATION AND EVACUATION (N/A )  Patient Location: PACU  Anesthesia Type:General  Level of Consciousness: sedated  Airway & Oxygen Therapy: Patient Spontanous Breathing and Patient connected to nasal cannula oxygen  Post-op Assessment: Report given to RN  Post vital signs: Reviewed and stable  Last Vitals:  Vitals Value Taken Time  BP    Temp    Pulse 112 07/23/2018 11:31 AM  Resp    SpO2 99 % 07/23/2018 11:31 AM  Vitals shown include unvalidated device data.  Last Pain:  Vitals:   07/23/18 0905  TempSrc: Oral  PainSc: 7       Patients Stated Pain Goal: 4 (07/23/18 0905)  Complications: No apparent anesthesia complications

## 2018-07-23 NOTE — Discharge Instructions (Signed)

## 2018-07-23 NOTE — Anesthesia Procedure Notes (Addendum)
Procedure Name: LMA Insertion Date/Time: 07/23/2018 10:57 AM Performed by: Algis GreenhouseBurger, Hannia Matchett A, CRNA Pre-anesthesia Checklist: Patient being monitored, Patient identified, Emergency Drugs available and Suction available Patient Re-evaluated:Patient Re-evaluated prior to induction Oxygen Delivery Method: Circle system utilized Preoxygenation: Pre-oxygenation with 100% oxygen Induction Type: IV induction and Inhalational induction Ventilation: Mask ventilation without difficulty LMA: LMA inserted LMA Size: 4.0 Number of attempts: 1 Dental Injury: Teeth and Oropharynx as per pre-operative assessment

## 2018-07-23 NOTE — Op Note (Signed)
Operative Report  [name] female 37 y.o. 9[name]/10/2018  Procedure:    * DILATATION AND EVACUATION  Preoperative Diagnosis:  12 week missed ab  Post operative Diagnosis:  same   Indications: The patient with diagnosed fetal demise by office ultrasound. Intrauterine pregnancy with no cardiac activity.      Surgeon: Essie HartWalda Kelsei Defino   Assistants: none  Anesthesia: General LMA anesthesia and Local anesthesia 1% buffered lidocaine  ASA Class: 2  Procedure Detail  Findings:  10 week size uterus to 7 size post procedure. Moderate amount of retained POC.  Good crie was achieved.  Estimated Blood Loss: 100 mL         Drains: straight cath prior to procedure         Total IV Fluids: 1100 ml  Blood Given: none          Specimens: POC (part for chromosomal analysis)         Implants: none        Complications:  None         Technique:   Patient was placed in dorsal lithotomy position in [employer]. After adequate anesthesia was achieved, the patient was prepped and draped in the usual sterile fashion. The operative Graves speculum was placed in the vagina and the cervix stabilized with a single-tooth tenaculum.  A paracervical block using 1% lidocaine was performed. The cervix was dilated with Shawnie PonsPratt dilators and the 10 mm curette was used to remove contents of the uterus.  Alternating sharp curettage with a curette and suction curettage was performed until all contents were removed and good crie was achieved.  All instruments were removed from the vagina.  The patient tolerated the procedure well.    Disposition: PACU - hemodynamically stable.         Condition: stable  [name] [name]

## 2018-07-23 NOTE — Anesthesia Preprocedure Evaluation (Signed)
Anesthesia Evaluation  Patient identified by MRN, date of birth, ID band Patient awake    Reviewed: Allergy & Precautions, H&P , NPO status , Patient's Chart, lab work & pertinent test results  Airway Mallampati: II  TM Distance: >3 FB Neck ROM: full    Dental no notable dental hx. (+) Teeth Intact   Pulmonary neg pulmonary ROS,    Pulmonary exam normal breath sounds clear to auscultation       Cardiovascular negative cardio ROS Normal cardiovascular exam Rhythm:regular Rate:Normal     Neuro/Psych    GI/Hepatic Neg liver ROS, GERD  ,  Endo/Other  Morbid obesity  Renal/GU negative Renal ROS     Musculoskeletal   Abdominal (+) + obese,   Peds  Hematology negative hematology ROS (+)   Anesthesia Other Findings   Reproductive/Obstetrics (+) Pregnancy                             Anesthesia Physical Anesthesia Plan  ASA: III  Anesthesia Plan: General   Post-op Pain Management:    Induction: Intravenous  PONV Risk Score and Plan: 3 and Ondansetron, Dexamethasone and Scopolamine patch - Pre-op  Airway Management Planned: LMA  Additional Equipment:   Intra-op Plan:   Post-operative Plan: Extubation in OR  Informed Consent: I have reviewed the patients History and Physical, chart, labs and discussed the procedure including the risks, benefits and alternatives for the proposed anesthesia with the patient or authorized representative who has indicated his/her understanding and acceptance.   Dental advisory given  Plan Discussed with: CRNA and Surgeon  Anesthesia Plan Comments:         Anesthesia Quick Evaluation

## 2018-07-24 ENCOUNTER — Encounter (HOSPITAL_COMMUNITY): Payer: Self-pay | Admitting: Obstetrics & Gynecology

## 2018-07-24 LAB — TYPE AND SCREEN
ABO/RH(D): O POS
Antibody Screen: POSITIVE
DAT, IGG: NEGATIVE

## 2018-08-14 LAB — TISSUE HYBRIDIZATION TO NCBH

## 2018-08-17 LAB — CHROMOSOME STD, POC(TISSUE)-NCBH

## 2018-10-06 ENCOUNTER — Other Ambulatory Visit: Payer: Self-pay

## 2018-10-06 ENCOUNTER — Emergency Department (HOSPITAL_COMMUNITY): Payer: BLUE CROSS/BLUE SHIELD

## 2018-10-06 ENCOUNTER — Emergency Department (HOSPITAL_COMMUNITY)
Admission: EM | Admit: 2018-10-06 | Discharge: 2018-10-06 | Disposition: A | Discharge status: Home / Self Care | Payer: BLUE CROSS/BLUE SHIELD | Attending: Emergency Medicine | Admitting: Emergency Medicine

## 2018-10-06 ENCOUNTER — Encounter (HOSPITAL_COMMUNITY): Payer: Self-pay

## 2018-10-06 DIAGNOSIS — J069 Acute upper respiratory infection, unspecified: Secondary | ICD-10-CM | POA: Insufficient documentation

## 2018-10-06 DIAGNOSIS — R05 Cough: Secondary | ICD-10-CM | POA: Diagnosis present

## 2018-10-06 LAB — URINALYSIS, ROUTINE W REFLEX MICROSCOPIC
Bilirubin Urine: NEGATIVE
Glucose, UA: NEGATIVE mg/dL
Hgb urine dipstick: NEGATIVE
Ketones, ur: NEGATIVE mg/dL
LEUKOCYTES UA: NEGATIVE
NITRITE: NEGATIVE
PH: 7 (ref 5.0–8.0)
PROTEIN: NEGATIVE mg/dL
Specific Gravity, Urine: 1.019 (ref 1.005–1.030)

## 2018-10-06 LAB — PREGNANCY, URINE: Preg Test, Ur: NEGATIVE

## 2018-10-06 MED ORDER — AEROCHAMBER PLUS FLO-VU MEDIUM MISC
1.0000 | Freq: Once | Status: AC
  Administered 2018-10-06: 1
  Filled 2018-10-06: qty 1

## 2018-10-06 MED ORDER — IBUPROFEN 600 MG PO TABS: 600.0000 mg | ORAL_TABLET | Freq: Four times a day (QID) | ORAL | 0 refills | Status: AC | PRN

## 2018-10-06 MED ORDER — KETOROLAC TROMETHAMINE 30 MG/ML IJ SOLN
30.0000 mg | Freq: Once | INTRAMUSCULAR | Status: AC
  Administered 2018-10-06: 30 mg via INTRAMUSCULAR
  Filled 2018-10-06: qty 1

## 2018-10-06 MED ORDER — OXYMETAZOLINE HCL 0.05 % NA SOLN
1.0000 | Freq: Once | NASAL | Status: AC
  Administered 2018-10-06: 1 via NASAL
  Filled 2018-10-06: qty 15

## 2018-10-06 MED ORDER — ALBUTEROL SULFATE HFA 108 (90 BASE) MCG/ACT IN AERS
1.0000 | INHALATION_SPRAY | Freq: Once | RESPIRATORY_TRACT | Status: AC
  Administered 2018-10-06: 2 via RESPIRATORY_TRACT
  Filled 2018-10-06: qty 6.7

## 2018-10-06 MED ORDER — LORATADINE-PSEUDOEPHEDRINE ER 5-120 MG PO TB12: 1.0000 | ORAL_TABLET | Freq: Two times a day (BID) | ORAL | 0 refills | Status: AC

## 2018-10-06 NOTE — ED Provider Notes (Signed)
[facility]-EMERGENCY DEPT Provider Note   CSN: [id number] Arrival date & time: 10/06/18  0801     History   Chief Complaint Chief Complaint  Patient presents with  . Cough    HPI [name] is a 37 y.o. female.  Pt presents to the ED today with cough and congestion.  She has taken OTC meds without relief of sx.  Pt has 3 children at home which have similar complaints.  She took them to their doctor who said they have an infection.  She does not know which kind, but said it is not the flu.  The pt denies f/c.       Past Medical History:  Diagnosis Date  . Bacterial infection   . Depression    no meds  . GERD (gastroesophageal reflux disease)    diet controlled, no meds  . Headache    otc med prn  . Irregular menses   . Seasonal allergies   . SVD (spontaneous vaginal delivery)    x 2    Patient Active Problem List   Diagnosis Date Noted  . Previous cesarean section--first pregnancy, breech/oligo 07/22/2018  . History of VBAC--x 2 07/22/2018  . Hypothyroidism in past, normal TSH at present 07/22/2018  . History of pyelonephritis during pregnancy--after last delivery 07/22/2018  . AMA (advanced maternal age) multigravida 35+ 07/22/2018  . BMI 45.0-49.9, adult (HCC) 07/22/2018  . History of depression 07/22/2018  . 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 Chronic migraine w/o aura w/o status migrainosus, not intractable 12/05/2016    Past Surgical History:  Procedure Laterality Date  . CESAREAN SECTION  2007   x 1  . DILATION AND CURETTAGE OF UTERUS     mab  . DILATION AND EVACUATION N/A 07/23/2018   Procedure: SUCTION DILATATION AND EVACUATION;  Surgeon: Essie HartPinn, Walda, MD;  Location: [facility] ORS;  Service: Gynecology;  Laterality: N/A;  . GALLBLADDER SURGERY       OB History    Gravida  6   Para  3   Term  3   Preterm      AB  2   Living  3     SAB  2   TAB      Ectopic      Multiple      Live Births  3             Home Medications    Prior to Admission medications   Medication Sig Start Date End Date Taking? Authorizing Provider  acetaminophen (TYLENOL) 325 MG tablet Take 650 mg by mouth every 6 (six) hours as needed for mild pain or headache.   Yes [provider]  aspirin-acetaminophen-caffeine (EXCEDRIN MIGRAINE) [phone] MG tablet Take 2 tablets by mouth every 6 (six) hours as needed for headache.   Yes [provider]  aspirin-sod bicarb-citric acid (ALKA-SELTZER) 325 MG TBEF tablet Take 650 mg by mouth every 6 (six) hours as needed (cough).   Yes [provider]  Iron-FA-B Cmp-C-Biot-Probiotic (FUSION PLUS) CAPS Take 1 capsule by mouth daily. 09/10/18  Yes [provider]  loratadine (CLARITIN) 10 MG tablet Take 10 mg by mouth daily.   Yes [provider]  Prenat-FeAsp-Meth-FA-DHA w/o A (PRENATE PIXIE) 10-0.6-0.4-200 MG CAPS Take 1 capsule by mouth daily. 09/10/18  Yes [provider]  ibuprofen (ADVIL,MOTRIN) 600 MG tablet Take 1 tablet (600 mg total) by mouth every 6 (six) hours as needed. 10/06/18   Jacalyn LefevreHaviland, Nica Friske, MD  loratadine-pseudoephedrine (  CLARITIN-D 12 HOUR) 5-120 MG tablet Take 1 tablet by mouth 2 (two) times daily. 10/06/18   Jacalyn Lefevre, MD  ondansetron (ZOFRAN ODT) 8 MG disintegrating tablet Take 1 tablet (8 mg total) by mouth every 8 (eight) hours as needed for nausea or vomiting. Patient not taking: Reported on 07/22/2018 09/18/16   Azalia Bilis, MD  oxyCODONE-acetaminophen (PERCOCET/ROXICET) 5-325 MG tablet Take 1-2 tablets by mouth every 4 (four) hours as needed for severe pain. Patient not taking: Reported on 10/06/2018 07/23/18   Essie Hart, MD  topiramate (TOPAMAX) 50 MG tablet Take 2 tablets (100 mg total) by mouth daily. Patient not taking: Reported on 07/22/2018 12/03/16   Anson Fret, MD  zolmitriptan (ZOMIG) 5 MG tablet Take 1 tablet (5 mg total) by mouth as needed for migraine. Patient not taking:  Reported on 07/22/2018 12/03/16   Anson Fret, MD    Family History Family History  Problem Relation Age of Onset  . Hypertension Paternal Grandfather   . Diabetes Father   . Headache Neg Hx     Social History Social History   Tobacco Use  . Smoking status: Never Smoker  . Smokeless tobacco: Never Used  Substance Use Topics  . Alcohol use: No  . Drug use: No     Allergies   Lorcaserin   Review of Systems Review of Systems  HENT: Positive for congestion and sinus pressure.   Respiratory: Positive for cough.   All other systems reviewed and are negative.    Physical Exam Updated Vital Signs BP (!) 130/102 (BP Location: Left Arm)   Pulse 83   Temp 98.1 F (36.7 C) (Oral)   Resp 16   Ht 5\' 5"  (1.651 m)   Wt 122.5 kg   LMP 09/22/2018   SpO2 100%   Breastfeeding? Unknown   BMI 44.93 kg/m   Physical Exam  Constitutional: She is oriented to person, place, and time. She appears well-developed and well-nourished.  HENT:  Head: Normocephalic and atraumatic.  Right Ear: External ear normal.  Left Ear: External ear normal.  Nose: Nose normal.  Mouth/Throat: Oropharynx is clear and moist.  Eyes: Pupils are equal, round, and reactive to light. Conjunctivae and EOM are normal.  Neck: Normal range of motion. Neck supple.  Cardiovascular: Normal rate, regular rhythm, normal heart sounds and intact distal pulses.  Pulmonary/Chest: Effort normal and breath sounds normal.  Abdominal: Soft. Bowel sounds are normal.  Musculoskeletal: Normal range of motion.  Neurological: She is alert and oriented to person, place, and time.  Skin: Skin is warm. Capillary refill takes less than 2 seconds.  Psychiatric: She has a normal mood and affect. Her behavior is normal. Judgment and thought content normal.  Nursing note and vitals reviewed.    ED Treatments / Results  Labs (all labs ordered are listed, but only abnormal results are displayed) Labs Reviewed  PREGNANCY,  URINE  URINALYSIS, ROUTINE W REFLEX MICROSCOPIC    EKG None  Radiology Dg Chest 2 View  Result Date: 10/06/2018 CLINICAL DATA:  Shortness of breath and cough for several days EXAM: CHEST - 2 VIEW COMPARISON:  07/02/2010 FINDINGS: The heart size and mediastinal contours are within normal limits. Both lungs are clear. The visualized skeletal structures are unremarkable. IMPRESSION: No active cardiopulmonary disease. Electronically Signed   By: Alcide Clever M.D.   On: 10/06/2018 09:21    Procedures Procedures (including critical care time)  Medications Ordered in ED Medications  albuterol (PROVENTIL HFA;VENTOLIN HFA) 108 (90 Base)  MCG/ACT inhaler 1-2 puff (2 puffs Inhalation Given 10/06/18 0842)  AEROCHAMBER PLUS FLO-VU MEDIUM MISC 1 each (1 each Other Given 10/06/18 0843)  oxymetazoline (AFRIN) 0.05 % nasal spray 1 spray (1 spray Each Nare Given 10/06/18 0844)  ketorolac (TORADOL) 30 MG/ML injection 30 mg (30 mg Intramuscular Given 10/06/18 0846)     Initial Impression / Assessment and Plan / ED Course  I have reviewed the triage vital signs and the nursing notes.  Pertinent labs & imaging results that were available during my care of the patient were reviewed by me and considered in my medical decision making (see chart for details).     Pt given an inhaler and afrin here.  She likely has a viral URI.  She will be d/c with ibuprofen and claritin D.  She knows to return if worse.  Final Clinical Impressions(s) / ED Diagnoses   Final diagnoses:  Viral upper respiratory tract infection    ED Discharge Orders         Ordered    ibuprofen (ADVIL,MOTRIN) 600 MG tablet  Every 6 hours PRN     10/06/18 0930    loratadine-pseudoephedrine (CLARITIN-D 12 HOUR) 5-120 MG tablet  2 times daily     10/06/18 0930           Jacalyn Lefevre, MD 10/06/18 [phone]

## 2018-10-06 NOTE — ED Notes (Signed)
Called pt for rooming x1

## 2018-10-06 NOTE — ED Triage Notes (Signed)
Pt states she has been sick with a cough for 3-4 days and has been taking OTC meds, like alka seltzer. Pt's children have had similar symptoms.

## 2019-12-31 IMAGING — CR DG CHEST 2V
2 series · 2 of 2 positions shown · non-contrast
Comparison: 07/02/2010

CLINICAL DATA: Shortness of breath and cough for several days

EXAM:
CHEST - 2 VIEW

[w chest pa]
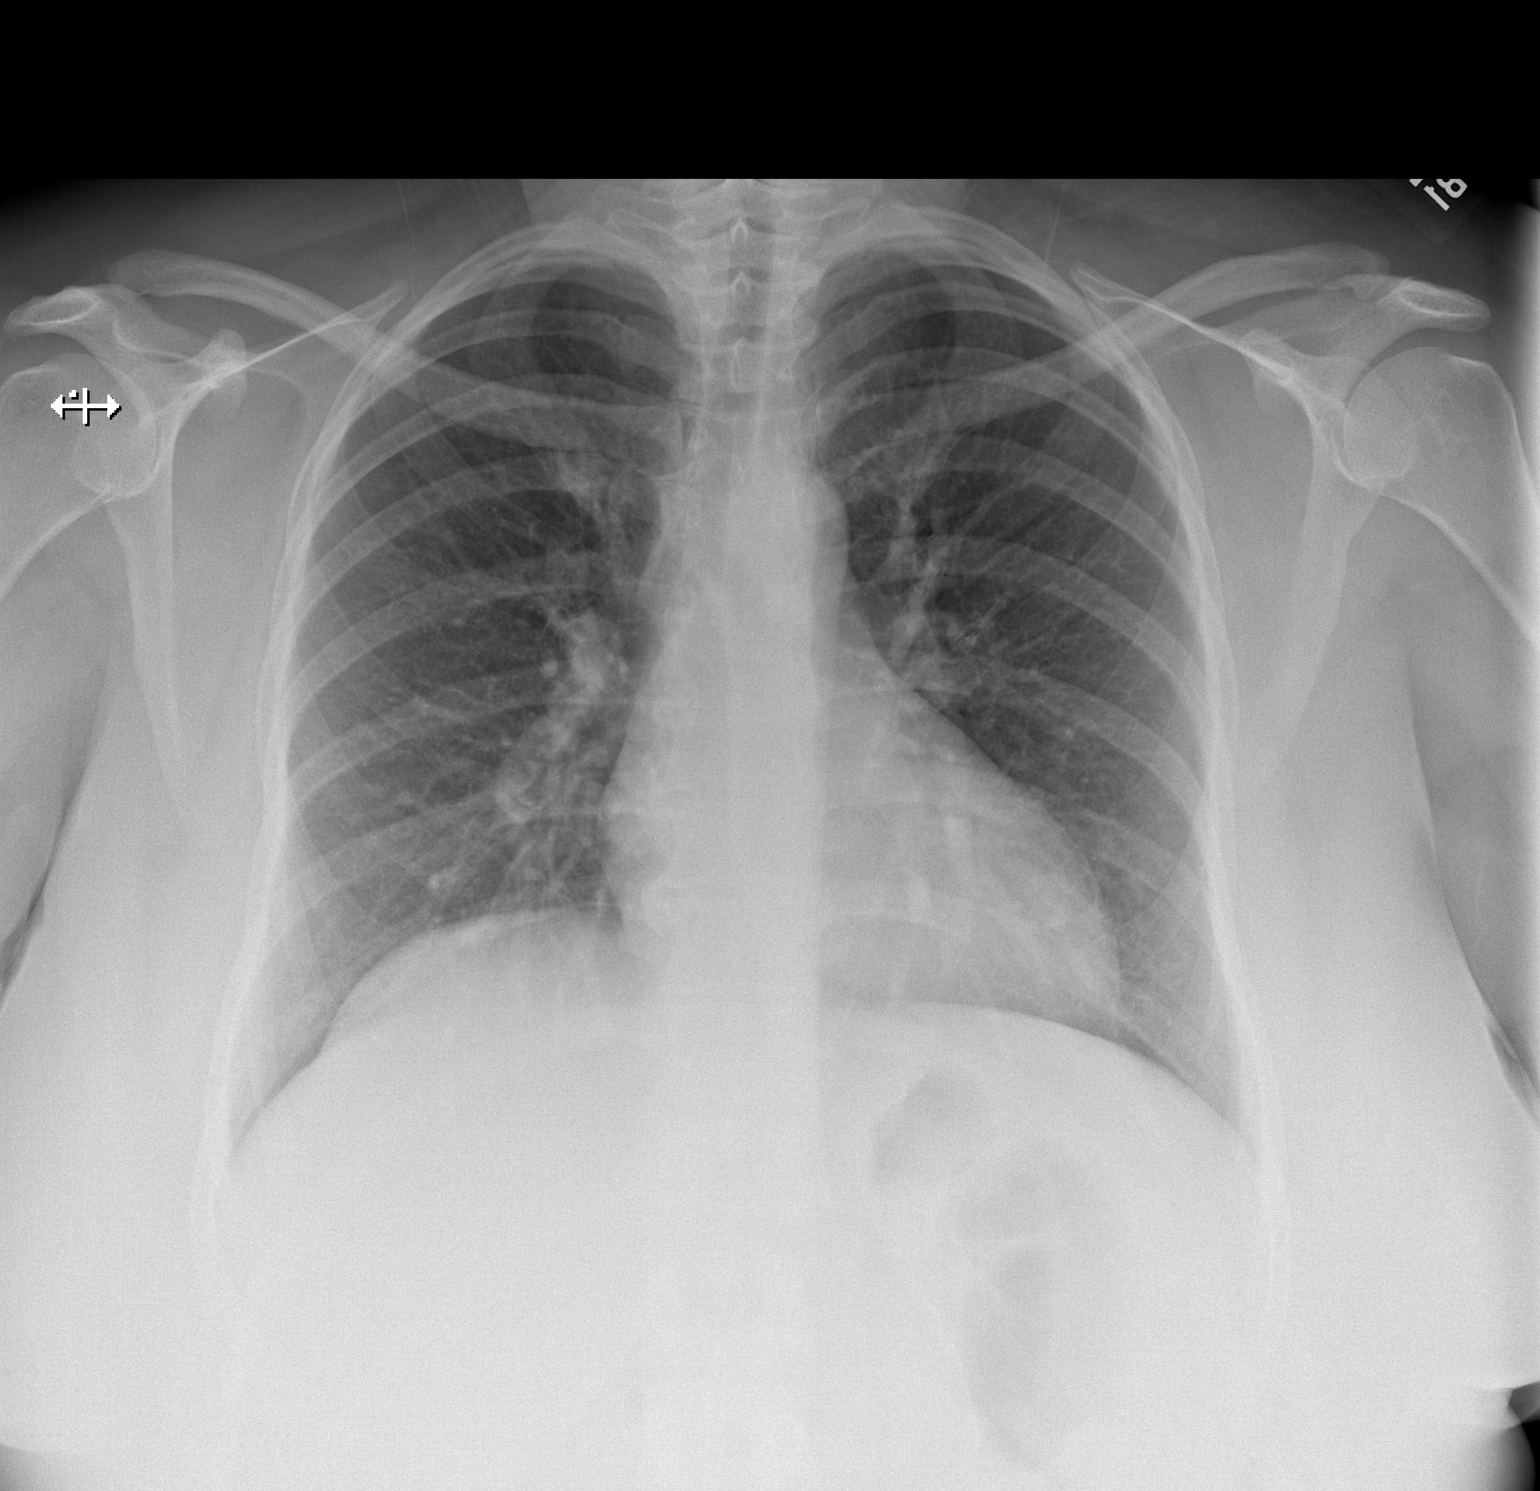

[w chest lat]
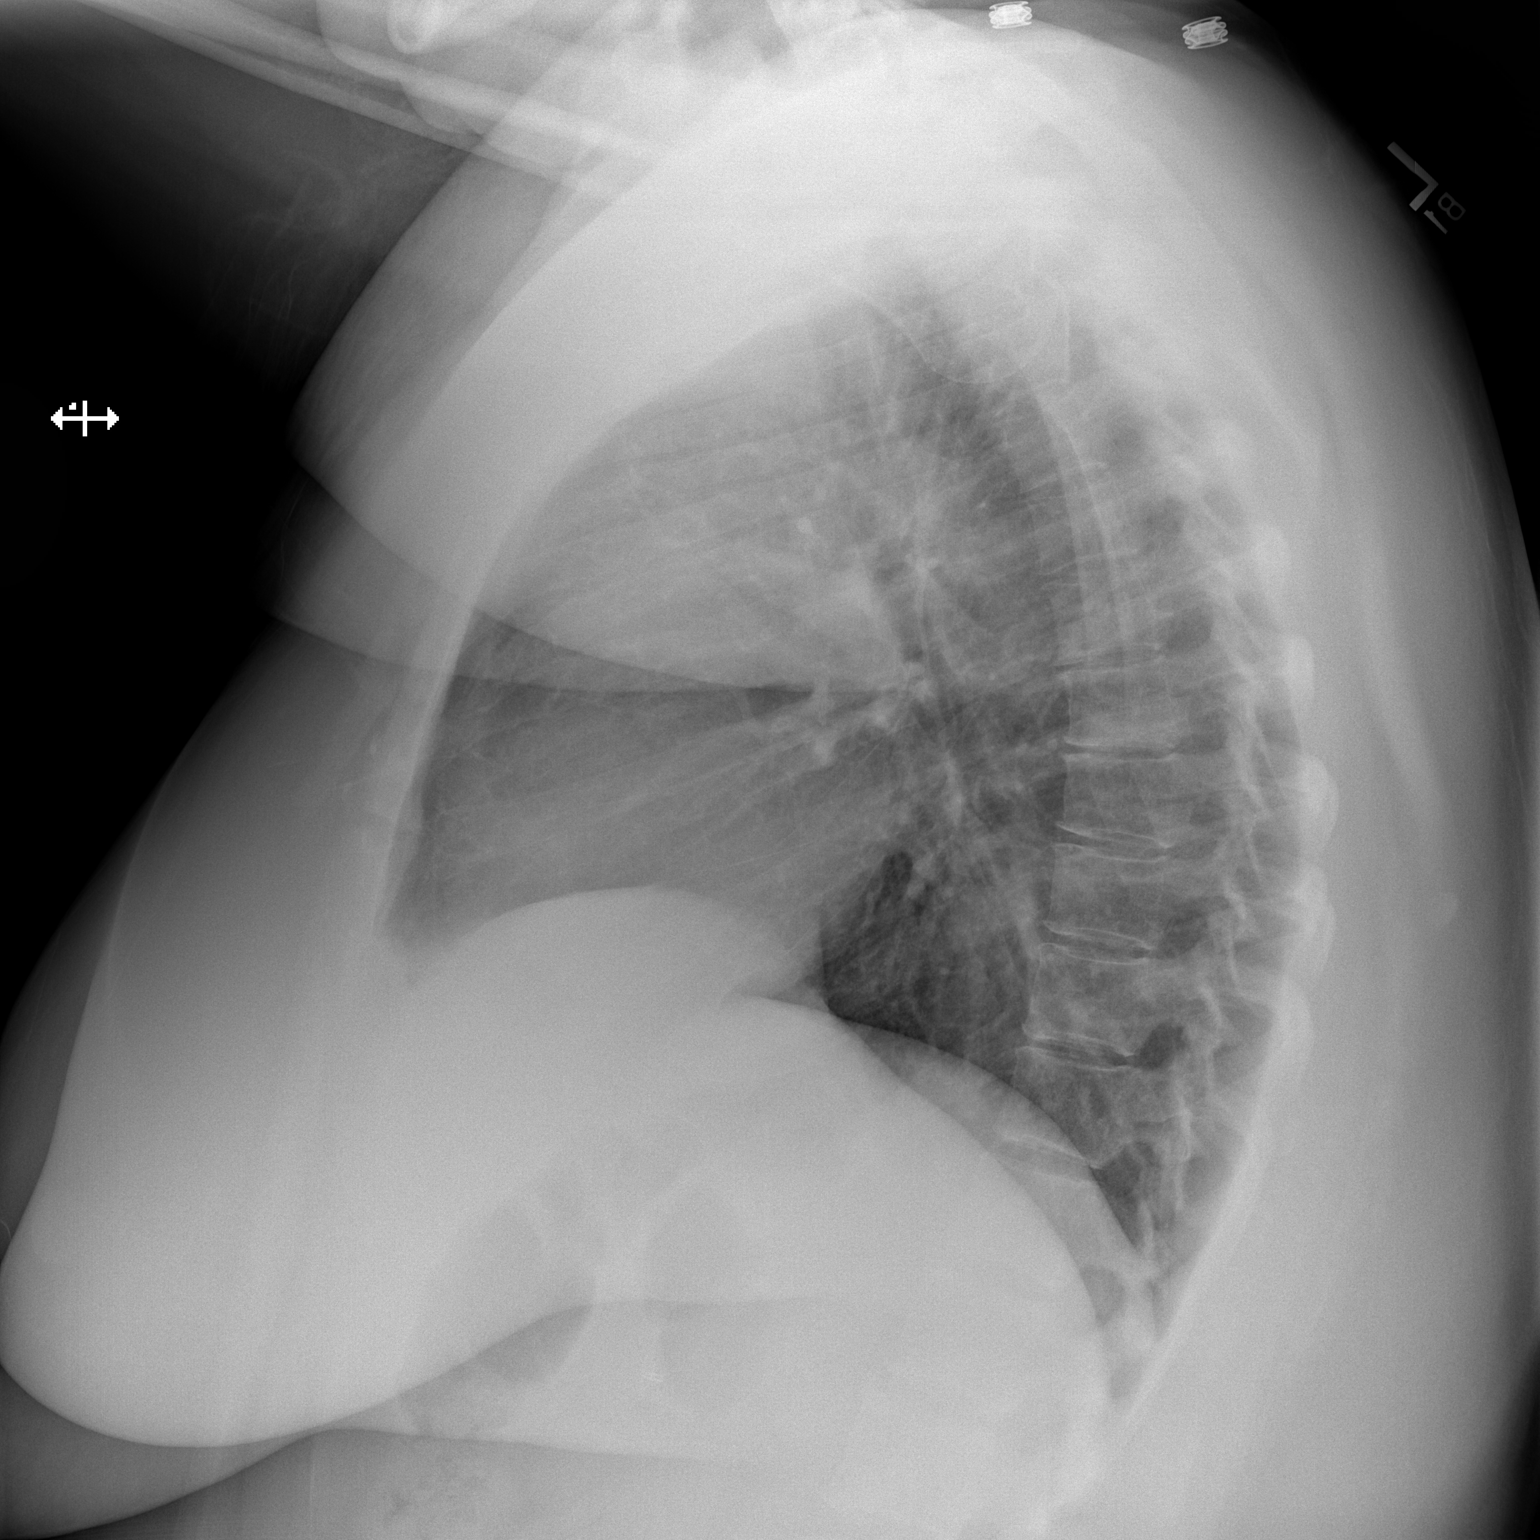

[2 of 2 positions shown; findings below may reference images not displayed]

FINDINGS: The heart size and mediastinal contours are within normal limits.
Both lungs are clear. The visualized skeletal structures are
unremarkable.
IMPRESSION: No active cardiopulmonary disease.

## 2022-07-02 ENCOUNTER — Ambulatory Visit: Payer: BLUE CROSS/BLUE SHIELD

## 2022-09-21 ENCOUNTER — Encounter (HOSPITAL_COMMUNITY): Payer: Self-pay

## 2022-09-21 ENCOUNTER — Ambulatory Visit
Admission: EM | Admit: 2022-09-21 | Discharge: 2022-09-21 | Disposition: A | Discharge status: Home / Self Care | Payer: BC Managed Care – PPO | Attending: Urgent Care | Admitting: Urgent Care

## 2022-09-21 ENCOUNTER — Emergency Department (HOSPITAL_COMMUNITY)
Admission: EM | Admit: 2022-09-21 | Discharge: 2022-09-21 | Discharge status: Against Medical Advice | Payer: BC Managed Care – PPO | Attending: Emergency Medicine | Admitting: Emergency Medicine

## 2022-09-21 DIAGNOSIS — Z5321 Procedure and treatment not carried out due to patient leaving prior to being seen by health care provider: Secondary | ICD-10-CM | POA: Diagnosis not present

## 2022-09-21 DIAGNOSIS — R519 Headache, unspecified: Secondary | ICD-10-CM | POA: Insufficient documentation

## 2022-09-21 DIAGNOSIS — R030 Elevated blood-pressure reading, without diagnosis of hypertension: Secondary | ICD-10-CM

## 2022-09-21 DIAGNOSIS — J018 Other acute sinusitis: Secondary | ICD-10-CM

## 2022-09-21 DIAGNOSIS — J069 Acute upper respiratory infection, unspecified: Secondary | ICD-10-CM | POA: Insufficient documentation

## 2022-09-21 DIAGNOSIS — R070 Pain in throat: Secondary | ICD-10-CM | POA: Diagnosis not present

## 2022-09-21 DIAGNOSIS — Z1152 Encounter for screening for COVID-19: Secondary | ICD-10-CM | POA: Diagnosis not present

## 2022-09-21 DIAGNOSIS — H5789 Other specified disorders of eye and adnexa: Secondary | ICD-10-CM

## 2022-09-21 DIAGNOSIS — R0981 Nasal congestion: Secondary | ICD-10-CM | POA: Insufficient documentation

## 2022-09-21 LAB — RESP PANEL BY RT-PCR (FLU A&B, COVID) ARPGX2
Influenza A by PCR: NEGATIVE
Influenza B by PCR: NEGATIVE
SARS Coronavirus 2 by RT PCR: NEGATIVE

## 2022-09-21 MED ORDER — POLYMYXIN B-TRIMETHOPRIM 10000-0.1 UNIT/ML-% OP SOLN: 1.0000 [drp] | OPHTHALMIC | 0 refills | Status: AC

## 2022-09-21 MED ORDER — AMOXICILLIN-POT CLAVULANATE 875-125 MG PO TABS: 1.0000 | ORAL_TABLET | Freq: Two times a day (BID) | ORAL | 0 refills | Status: AC

## 2022-09-21 MED ORDER — CETIRIZINE HCL 10 MG PO TABS: 10.0000 mg | ORAL_TABLET | Freq: Every day | ORAL | 0 refills | Status: AC

## 2022-09-21 NOTE — ED Provider Notes (Signed)
[facility]  Note:  This document was prepared using Conservation officer, historic buildings and may include unintentional dictation errors.  MRN: [id number] DOB: 22-Jun-1981  Subjective:   [name] is a 41 y.o. female presenting for 1 week history of persistent and worsening throat pain, congestion, sinus headaches, painful swallowing.  In the past 2 to 3 days her symptoms have been worse, has had redness of both eyes.  No drainage.  No confusion, weakness, numbness or tingling, diaphoresis, chest pain, shortness of breath or wheezing.  Patient has been using Hall's, ibuprofen and Claritin.  She is also started to use pseudoephedrine.  No history of hypertension, has never had to take medications for this.  No history of stroke, heart disease or cerebrovascular disease.  Patient is not a smoker.  No history of asthma.  She does have allergies however.  Of note, patient did go to the emergency room and left without being seen.  Has not had lab review.  No current facility-administered medications for this encounter.  Current Outpatient Medications:    acetaminophen (TYLENOL) 325 MG tablet, Take 650 mg by mouth every 6 (six) hours as needed for mild pain or headache., Disp: , Rfl:    aspirin-acetaminophen-caffeine (EXCEDRIN MIGRAINE) 250-250-65 MG tablet, Take 2 tablets by mouth every 6 (six) hours as needed for headache., Disp: , Rfl:    ibuprofen (ADVIL,MOTRIN) 600 MG tablet, Take 1 tablet (600 mg total) by mouth every 6 (six) hours as needed., Disp: 30 tablet, Rfl: 0   loratadine (CLARITIN) 10 MG tablet, Take 10 mg by mouth daily., Disp: , Rfl:    loratadine-pseudoephedrine (CLARITIN-D 12 HOUR) 5-120 MG tablet, Take 1 tablet by mouth 2 (two) times daily., Disp: 30 tablet, Rfl: 0   Allergies  Allergen Reactions   Lorcaserin Other (See Comments)    Headache    Past Medical History:  Diagnosis Date   Bacterial infection    Depression    no meds   GERD  (gastroesophageal reflux disease)    diet controlled, no meds   Headache    otc med prn   Irregular menses    Seasonal allergies    SVD (spontaneous vaginal delivery)    x 2     Past Surgical History:  Procedure Laterality Date   CESAREAN SECTION  2007   x 1   DILATION AND CURETTAGE OF UTERUS     mab   DILATION AND EVACUATION N/A 07/23/2018   Procedure: SUCTION DILATATION AND EVACUATION;  Surgeon: Essie Hart, MD;  Location: [facility];  Service: Gynecology;  Laterality: N/A;   GALLBLADDER SURGERY      Family History  Problem Relation Age of Onset   Hypertension Paternal Grandfather    Diabetes Father    Headache Neg Hx     Social History   Tobacco Use   Smoking status: Never   Smokeless tobacco: Never  Vaping Use   Vaping Use: Never used  Substance Use Topics   Alcohol use: No   Drug use: No    ROS   Objective:   Vitals: BP (!) 156/91 (BP Location: Left Arm)   Pulse 93   Temp 98.3 F (36.8 C) (Oral)   Resp 18   LMP 08/27/2022   SpO2 97%   Breastfeeding No   BP Readings from Last 3 Encounters:  09/21/22 (!) 156/91  09/21/22 (!) 170/119  10/06/18 132/87   Physical Exam Constitutional:      General: She is  not in acute distress.    Appearance: Normal appearance. She is well-developed and normal weight. She is not ill-appearing, toxic-appearing or diaphoretic.  HENT:     Head: Normocephalic and atraumatic.     Right Ear: Tympanic membrane, ear canal and external ear normal. No drainage or tenderness. No middle ear effusion. There is no impacted cerumen. Tympanic membrane is not erythematous or bulging.     Left Ear: Tympanic membrane, ear canal and external ear normal. No drainage or tenderness.  No middle ear effusion. There is no impacted cerumen. Tympanic membrane is not erythematous or bulging.     Nose: Congestion present. No rhinorrhea.     Mouth/Throat:     Mouth: Mucous membranes are moist. No oral lesions.     Pharynx: No pharyngeal swelling,  oropharyngeal exudate, posterior oropharyngeal erythema or uvula swelling.     Tonsils: No tonsillar exudate or tonsillar abscesses.  Eyes:     General: Lids are normal. Lids are everted, no foreign bodies appreciated. Vision grossly intact. No scleral icterus.       Right eye: No foreign body, discharge or hordeolum.        Left eye: No foreign body, discharge or hordeolum.     Extraocular Movements: Extraocular movements intact.     Right eye: Normal extraocular motion.     Left eye: Normal extraocular motion and no nystagmus.     Conjunctiva/sclera:     Right eye: Right conjunctiva is injected. No chemosis, exudate or hemorrhage.    Left eye: Left conjunctiva is injected. No chemosis, exudate or hemorrhage.    Pupils: Pupils are equal, round, and reactive to light.  Neck:     Meningeal: Brudzinski's sign and Kernig's sign absent.  Cardiovascular:     Rate and Rhythm: Normal rate and regular rhythm.     Heart sounds: Normal heart sounds. No murmur heard.    No friction rub. No gallop.  Pulmonary:     Effort: Pulmonary effort is normal. No respiratory distress.     Breath sounds: No stridor. No wheezing, rhonchi or rales.  Chest:     Chest wall: No tenderness.  Musculoskeletal:     Cervical back: Normal range of motion and neck supple.  Lymphadenopathy:     Cervical: No cervical adenopathy.  Skin:    General: Skin is warm and dry.  Neurological:     General: No focal deficit present.     Mental Status: She is alert and oriented to person, place, and time.     Cranial Nerves: No cranial nerve deficit, dysarthria or facial asymmetry.     Motor: No weakness.     Coordination: Romberg sign negative. Coordination normal.     Gait: Gait normal.     Comments: Negative Romberg and pronator drift.  Psychiatric:        Mood and Affect: Mood normal.        Behavior: Behavior normal.     Results for orders placed or performed during the hospital encounter of 09/21/22 (from the past  24 hour(s))  Resp Panel by RT-PCR (Flu A&B, Covid) Anterior Nasal Swab     Status: None   Collection Time: 09/21/22  7:50 AM   Specimen: Anterior Nasal Swab  Result Value Ref Range   SARS Coronavirus 2 by RT PCR NEGATIVE NEGATIVE   Influenza A by PCR NEGATIVE NEGATIVE   Influenza B by PCR NEGATIVE NEGATIVE    Assessment and Plan :   PDMP not reviewed this  encounter.  1. Acute non-recurrent sinusitis of other sinus   2. Sinus headache   3. Elevated blood pressure reading in office without diagnosis of hypertension   4. Throat pain   5. Redness of both eyes     All labs reviewed with her from her ER visit.  Low suspicion for an acute encephalopathy.  Patient has a normal neurologic exam. Will start empiric treatment for sinusitis with Augmentin.  Recommended supportive care otherwise including the use of oral antihistamine, Tylenol for headaches.  Monitor blood pressure and hold off on using decongestants as this may be contributing to her blood pressure elevations.  Recheck with PCP as soon as possible.  Counseled patient on potential for adverse effects with medications prescribed/recommended today, ER and return-to-clinic precautions discussed, patient verbalized understanding.    Wallis Bamberg, PA-C 09/21/22 1147

## 2022-09-21 NOTE — ED Triage Notes (Signed)
Patient reports she was in the ED today. Did not wait due to long wait time Tested for covid/flu both negative.

## 2022-09-21 NOTE — ED Triage Notes (Signed)
Patients presents to UC for sore throat, HA, and elevated BP. Pt states her BP this morning was 160/88. No hx of HTN. Treating with halls, ibuprofen, and Claritin.   Denies fever.

## 2022-09-21 NOTE — ED Triage Notes (Signed)
Pt arrived via POV, c/o headache, sore throat and red eyes. Denies any fevers, denies any sick contacts

## 2022-09-21 NOTE — ED Provider Triage Note (Signed)
Emergency Medicine Provider Triage Evaluation Note  [name] , a 41 y.o. female  was evaluated in triage.  Pt complains of headache, URI symptoms.  Patient states that she was diagnosed with viral upper respiratory infection several days ago at an urgent care.  She was told to use Flonase and other over-the-counter medications.  She had a strep test and COVID test at that time which were negative.  She has continued to have some symptoms but yesterday developed a migratory headache.  No associated light sensitivity or vomiting.  No fevers.  She has also continued to have nasal congestion and has developed redness of her bilateral eyes.  She has used over-the-counter medications for headache.  Headache is not positional in nature.  Review of Systems  Positive: Headache, nasal congestion Negative: Confusion, fever, vomiting  Physical Exam  BP (!) 170/119 (BP Location: Left Arm)   Pulse 88   Temp 98.5 F (36.9 C) (Oral)   Resp 18   SpO2 100%  Gen:   Awake, no distress   Resp:  Normal effort  MSK:   Moves extremities without difficulty  Other:  Eyes, bilateral scleral injection without exudate; ears no otitis media  Medical Decision Making  Medically screening exam initiated at 7:40 AM.  Appropriate orders placed.  Ariellah H Abdon was informed that the remainder of the evaluation will be completed by another provider, this initial triage assessment does not replace that evaluation, and the importance of remaining in the ED until their evaluation is complete.     Renne Crigler, PA-C 09/21/22 [phone]

## 2024-01-14 ENCOUNTER — Ambulatory Visit: Admission: EM | Admit: 2024-01-14 | Discharge: 2024-01-14 | Disposition: A | Discharge status: Home / Self Care

## 2024-01-14 DIAGNOSIS — R112 Nausea with vomiting, unspecified: Secondary | ICD-10-CM | POA: Diagnosis not present

## 2024-01-14 DIAGNOSIS — G43019 Migraine without aura, intractable, without status migrainosus: Secondary | ICD-10-CM

## 2024-01-14 LAB — POC COVID19/FLU A&B COMBO
Covid Antigen, POC: NEGATIVE
Influenza A Antigen, POC: NEGATIVE
Influenza B Antigen, POC: NEGATIVE

## 2024-01-14 MED ORDER — ONDANSETRON 4 MG PO TBDP: 4.0000 mg | ORAL_TABLET | Freq: Three times a day (TID) | ORAL | 0 refills | Status: DC | PRN

## 2024-01-14 MED ORDER — BUTALBITAL-APAP-CAFFEINE 50-325-40 MG PO TABS: 1.0000 | ORAL_TABLET | Freq: Four times a day (QID) | ORAL | 0 refills | Status: DC | PRN

## 2024-01-14 MED ORDER — KETOROLAC TROMETHAMINE 60 MG/2ML IM SOLN
30.0000 mg | Freq: Once | INTRAMUSCULAR | Status: AC
  Administered 2024-01-14: 30 mg via INTRAMUSCULAR

## 2024-01-14 MED ORDER — ONDANSETRON 4 MG PO TBDP
4.0000 mg | ORAL_TABLET | Freq: Once | ORAL | Status: AC
  Administered 2024-01-14: 4 mg via ORAL

## 2024-01-14 NOTE — ED Provider Notes (Signed)
 [facility]  Note:  This document was prepared using Dragon voice recognition software and may include unintentional dictation errors.  MRN: [id number] DOB: [birth year]  Subjective:   [name] is a 43 y.o. female presenting for persistent headache, photophobia, nausea and vomiting, mild dizziness x 2 hours.  Patient reports past history of migraine headaches.  Patient took dose of Excedrin Migraine and meclizine at home with minimal improvement of symptoms.  Patient denies any diarrhea, abdominal pain, nasal congestion, cough, sore throat, shortness of breath, chest pain.  No current facility-administered medications for this encounter.  Current Outpatient Medications:    butalbital-acetaminophen-caffeine (FIORICET) 50-325-40 MG tablet, Take 1-2 tablets by mouth every 6 (six) hours as needed for migraine., Disp: 20 tablet, Rfl: 0   ondansetron (ZOFRAN-ODT) 4 MG disintegrating tablet, Take 1 tablet (4 mg total) by mouth every 8 (eight) hours as needed for nausea or vomiting., Disp: 20 tablet, Rfl: 0   acetaminophen (TYLENOL) 325 MG tablet, Take 650 mg by mouth every 6 (six) hours as needed for mild pain or headache., Disp: , Rfl:    amoxicillin-clavulanate (AUGMENTIN) 875-125 MG tablet, Take 1 tablet by mouth 2 (two) times daily., Disp: 14 tablet, Rfl: 0   aspirin-acetaminophen-caffeine (EXCEDRIN MIGRAINE) 250-250-65 MG tablet, Take 2 tablets by mouth every 6 (six) hours as needed for headache., Disp: , Rfl:    cetirizine (ZYRTEC ALLERGY) 10 MG tablet, Take 1 tablet (10 mg total) by mouth daily., Disp: 30 tablet, Rfl: 0   ibuprofen (ADVIL,MOTRIN) 600 MG tablet, Take 1 tablet (600 mg total) by mouth every 6 (six) hours as needed., Disp: 30 tablet, Rfl: 0   loratadine (CLARITIN) 10 MG tablet, Take 10 mg by mouth daily., Disp: , Rfl:    loratadine-pseudoephedrine (CLARITIN-D 12 HOUR) 5-120 MG tablet, Take 1 tablet by mouth 2 (two) times daily., Disp: 30 tablet, Rfl: 0    trimethoprim-polymyxin b (POLYTRIM) ophthalmic solution, Place 1 drop into both eyes every 4 (four) hours., Disp: 10 mL, Rfl: 0   Allergies  Allergen Reactions   Lorcaserin Other (See Comments)    Headache  Headache  Headache  Headache    Headache Headache    Past Medical History:  Diagnosis Date   Bacterial infection    Depression    no meds   GERD (gastroesophageal reflux disease)    diet controlled, no meds   Headache    otc med prn   Irregular menses    Seasonal allergies    SVD (spontaneous vaginal delivery)    x 2     Past Surgical History:  Procedure Laterality Date   CESAREAN SECTION  2007   x 1   DILATION AND CURETTAGE OF UTERUS     mab   DILATION AND EVACUATION N/A 07/23/2018   Procedure: SUCTION DILATATION AND EVACUATION;  Surgeon: Essie Hart, MD;  Location: [facility];  Service: Gynecology;  Laterality: N/A;   GALLBLADDER SURGERY      Family History  Problem Relation Age of Onset   Hypertension Paternal Grandfather    Diabetes Father    Headache Neg Hx     Social History   Tobacco Use   Smoking status: Never   Smokeless tobacco: Never  Vaping Use   Vaping status: Never Used  Substance Use Topics   Alcohol use: No   Drug use: No    ROS Refer to HPI for ROS details.  Objective:   Vitals: BP (!) 155/103 (BP Location: Right Arm)   Pulse  92   Temp 98 F (36.7 C) (Oral)   Resp 16   LMP 01/09/2024   SpO2 98%   Physical Exam Vitals and nursing note reviewed.  Constitutional:      General: She is not in acute distress.    Appearance: She is well-developed. She is ill-appearing. She is not toxic-appearing.  HENT:     Head: Normocephalic and atraumatic.     Mouth/Throat:     Mouth: Mucous membranes are moist.  Eyes:     Extraocular Movements: Extraocular movements intact.     Right eye: Normal extraocular motion.     Left eye: Normal extraocular motion.     Conjunctiva/sclera: Conjunctivae normal.     Pupils: Pupils are equal,  round, and reactive to light.  Cardiovascular:     Rate and Rhythm: Normal rate and regular rhythm.  Pulmonary:     Effort: Pulmonary effort is normal. No respiratory distress.     Breath sounds: Normal breath sounds.  Musculoskeletal:     Cervical back: No rigidity.  Lymphadenopathy:     Cervical: No cervical adenopathy.  Skin:    General: Skin is warm and dry.     Capillary Refill: Capillary refill takes less than 2 seconds.  Neurological:     Mental Status: She is alert and oriented to person, place, and time.     Cranial Nerves: No cranial nerve deficit or facial asymmetry.     Sensory: No sensory deficit.     Motor: No weakness.  Psychiatric:        Mood and Affect: Mood normal.        Speech: Speech normal.        Behavior: Behavior normal.     Results for orders placed or performed during the hospital encounter of 01/14/24 (from the past 24 hours)  POC Covid19/Flu A&B Antigen     Status: Normal   Collection Time: 01/14/24  7:03 PM  Result Value Ref Range   Influenza A Antigen, POC Negative    Influenza B Antigen, POC Negative    Covid Antigen, POC Negative     Assessment and Plan :   Migraine headache with acute nausea and vomiting -Rapid testing for COVID and flu performed in UC is negative. -Patient given dose of Toradol 30 mg IM injection and ondansetron 4 mg oral disintegrating tablet for symptoms with improvement. -Use prescribed Fioricet 1 to 2 tablets every 6 hours as needed for migraine headaches. -Prescribed ondansetron 4 mg disintegrating tablet every 8 hours as needed for nausea and vomiting symptoms. -Continue to monitor symptoms for worsening severity if you experience escalation of symptoms or new onset of symptoms follow-up in ER immediately for further evaluation management.  PDMP not reviewed this encounter.  1. Intractable migraine without aura and without status migrainosus   2. Nausea and vomiting, unspecified vomiting type        Lucky Cowboy, NP 01/14/24 1920

## 2024-01-14 NOTE — Discharge Instructions (Addendum)
  Migraine headache with acute nausea and vomiting -Rapid testing for COVID and flu performed in UC is negative. -Patient given dose of Toradol 30 mg IM injection and ondansetron 4 mg oral disintegrating tablet for symptoms with improvement. -Use prescribed Fioricet 1 to 2 tablets every 6 hours as needed for migraine headaches. -Prescribed ondansetron 4 mg disintegrating tablet every 8 hours as needed for nausea and vomiting symptoms. -Continue to monitor symptoms for worsening severity if you experience escalation of symptoms or new onset of symptoms follow-up in ER immediately for further evaluation management.

## 2024-01-14 NOTE — ED Triage Notes (Signed)
 Pt c/o HA, n/v, dizzy, "feel cold" x 2 hours-NAD-steady gait

## 2024-06-30 ENCOUNTER — Ambulatory Visit
Admission: EM | Admit: 2024-06-30 | Discharge: 2024-06-30 | Disposition: A | Discharge status: Home / Self Care | Attending: Family Medicine | Admitting: Family Medicine

## 2024-06-30 DIAGNOSIS — G43809 Other migraine, not intractable, without status migrainosus: Secondary | ICD-10-CM

## 2024-06-30 MED ORDER — ONDANSETRON 4 MG PO TBDP: 4.0000 mg | ORAL_TABLET | Freq: Three times a day (TID) | ORAL | 0 refills | Status: AC | PRN

## 2024-06-30 MED ORDER — ONDANSETRON 8 MG PO TBDP
8.0000 mg | ORAL_TABLET | Freq: Once | ORAL | Status: AC
  Administered 2024-06-30: 8 mg via ORAL

## 2024-06-30 MED ORDER — KETOROLAC TROMETHAMINE 60 MG/2ML IM SOLN
60.0000 mg | Freq: Once | INTRAMUSCULAR | Status: AC
  Administered 2024-06-30: 60 mg via INTRAMUSCULAR

## 2024-06-30 MED ORDER — BUTALBITAL-APAP-CAFFEINE 50-325-40 MG PO TABS: 1.0000 | ORAL_TABLET | Freq: Four times a day (QID) | ORAL | 0 refills | Status: AC | PRN

## 2024-06-30 NOTE — ED Triage Notes (Signed)
 Pt c/o migraine stared yesterday and left earache started 2am-vomited x 1 this am-taking ibuprofen  with some relief-states she has neuro appt Sept 6-NAD-steady gait

## 2024-06-30 NOTE — ED Provider Notes (Signed)
 [facility]  Note:  This document was prepared using Conservation officer, historic buildings and may include unintentional dictation errors.  MRN: [id number] DOB: 1981/08/23  Subjective:   [name] is a 43 y.o. female presenting for 1 day history of severe migraine with photophobia, nausea, vomiting, tingling, dizziness, right ear pain.  Patient has longstanding history of chronic migraines.  Patient reports that she feels like this 1 is worse.  Usually she can take Fioricet for them but she is out.  Otherwise she does use ibuprofen  and alternates with Advil .  Uses Midol  during her cycles that elicit migraines.  She has an appointment with her neurologist set up for September 6.  No fever, sinus symptoms, respiratory symptoms, weakness in the limbs.  No history of stroke, aneurysm.  Patient had an MRI done 2 or 3 years ago and was normal.  No history of gastric bypass.  No history of CKD.  No current facility-administered medications for this encounter.  Current Outpatient Medications:    acetaminophen  (TYLENOL ) 325 MG tablet, Take 650 mg by mouth every 6 (six) hours as needed for mild pain or headache., Disp: , Rfl:    amoxicillin -clavulanate (AUGMENTIN ) 875-125 MG tablet, Take 1 tablet by mouth 2 (two) times daily., Disp: 14 tablet, Rfl: 0   aspirin-acetaminophen -caffeine  (EXCEDRIN MIGRAINE) 250-250-65 MG tablet, Take 2 tablets by mouth every 6 (six) hours as needed for headache., Disp: , Rfl:    butalbital -acetaminophen -caffeine  (FIORICET) 50-325-40 MG tablet, Take 1-2 tablets by mouth every 6 (six) hours as needed for migraine., Disp: 20 tablet, Rfl: 0   cetirizine  (ZYRTEC  ALLERGY) 10 MG tablet, Take 1 tablet (10 mg total) by mouth daily., Disp: 30 tablet, Rfl: 0   ibuprofen  (ADVIL ,MOTRIN ) 600 MG tablet, Take 1 tablet (600 mg total) by mouth every 6 (six) hours as needed., Disp: 30 tablet, Rfl: 0   loratadine  (CLARITIN ) 10 MG tablet, Take 10 mg by mouth daily.,  Disp: , Rfl:    loratadine -pseudoephedrine  (CLARITIN -D 12 HOUR) 5-120 MG tablet, Take 1 tablet by mouth 2 (two) times daily., Disp: 30 tablet, Rfl: 0   ondansetron  (ZOFRAN -ODT) 4 MG disintegrating tablet, Take 1 tablet (4 mg total) by mouth every 8 (eight) hours as needed for nausea or vomiting., Disp: 20 tablet, Rfl: 0   trimethoprim -polymyxin b  (POLYTRIM ) ophthalmic solution, Place 1 drop into both eyes every 4 (four) hours., Disp: 10 mL, Rfl: 0   Allergies  Allergen Reactions   Lorcaserin Other (See Comments)    Headache  Headache  Headache  Headache    Headache Headache    Past Medical History:  Diagnosis Date   Bacterial infection    Depression    no meds   GERD (gastroesophageal reflux disease)    diet controlled, no meds   Headache    otc med prn   Irregular menses    Seasonal allergies    SVD (spontaneous vaginal delivery)    x 2     Past Surgical History:  Procedure Laterality Date   CESAREAN SECTION  2007   x 1   DILATION AND CURETTAGE OF UTERUS     mab   DILATION AND EVACUATION N/A 07/23/2018   Procedure: SUCTION DILATATION AND EVACUATION;  Surgeon: Bettina Muskrat, MD;  Location: [facility];  Service: Gynecology;  Laterality: N/A;   GALLBLADDER SURGERY      Family History  Problem Relation Age of Onset   Hypertension Paternal Grandfather    Diabetes Father  Headache Neg Hx     Social History   Tobacco Use   Smoking status: Never   Smokeless tobacco: Never  Vaping Use   Vaping status: Never Used  Substance Use Topics   Alcohol use: No   Drug use: No    ROS   Objective:   Vitals: BP (!) 132/90   Pulse (!) 105   Temp 98.5 F (36.9 C) (Oral)   Resp 20   LMP 06/07/2024   SpO2 98%   BP Readings from Last 3 Encounters:  06/30/24 (!) 132/90  01/14/24 (!) 155/103  09/21/22 (!) 156/91    Physical Exam Constitutional:      General: She is not in acute distress.    Appearance: Normal appearance. She is well-developed and normal weight. She  is not ill-appearing, toxic-appearing or diaphoretic.  HENT:     Head: Normocephalic and atraumatic.     Right Ear: Tympanic membrane, ear canal and external ear normal. No drainage or tenderness. No middle ear effusion. There is no impacted cerumen. Tympanic membrane is not erythematous or bulging.     Left Ear: Tympanic membrane, ear canal and external ear normal. No drainage or tenderness.  No middle ear effusion. There is no impacted cerumen. Tympanic membrane is not erythematous or bulging.     Nose: Nose normal. No congestion or rhinorrhea.     Mouth/Throat:     Mouth: Mucous membranes are moist. No oral lesions.     Pharynx: No pharyngeal swelling, oropharyngeal exudate, posterior oropharyngeal erythema or uvula swelling.     Tonsils: No tonsillar exudate or tonsillar abscesses.  Eyes:     General: No scleral icterus.       Right eye: No discharge.        Left eye: No discharge.     Extraocular Movements: Extraocular movements intact.     Right eye: Normal extraocular motion.     Left eye: Normal extraocular motion.     Conjunctiva/sclera: Conjunctivae normal.  Neck:     Meningeal: Brudzinski's sign and Kernig's sign absent.  Cardiovascular:     Rate and Rhythm: Normal rate.  Pulmonary:     Effort: Pulmonary effort is normal.  Musculoskeletal:     Cervical back: Normal range of motion and neck supple.  Lymphadenopathy:     Cervical: No cervical adenopathy.  Skin:    General: Skin is warm and dry.  Neurological:     General: No focal deficit present.     Mental Status: She is alert and oriented to person, place, and time.     Cranial Nerves: No cranial nerve deficit, dysarthria or facial asymmetry.     Motor: No weakness or pronator drift.     Coordination: Romberg sign negative. Coordination normal. Finger-Nose-Finger Test and Heel to [facility] Test normal. Rapid alternating movements normal.     Gait: Gait and tandem walk normal.     Deep Tendon Reflexes: Reflexes normal.   Psychiatric:        Mood and Affect: Mood normal.        Behavior: Behavior normal.        Thought Content: Thought content normal.        Judgment: Judgment normal.    IM Toradol  60 mg administered in clinic.  P.o. Zofran  ODT 4 mg administered in clinic.  Assessment and Plan :   PDMP not reviewed this encounter.  1. Other migraine without status migrainosus, not intractable    Patient is not inclined to go  to the emergency room now due to the wait time.  I did offer her medications for acute on chronic migraine.  Emphasized need to maintain strict ER precautions.  Counseled patient on potential for adverse effects with medications prescribed today, patient verbalized understanding.  Patient was planning on going to work today but I advised against this.  Counseled patient on potential for adverse effects with medications prescribed/recommended today, ER and return-to-clinic precautions discussed, patient verbalized understanding.    Christopher Savannah, [place] 06/30/24 [phone]

## 2025-02-14 ENCOUNTER — Ambulatory Visit: Admitting: Neurology
